# Patient Record
Sex: Male | Born: 1935 | Race: White | Hispanic: No | Marital: Married | Smoking: Former smoker
Health system: Southern US, Community
[De-identification: ages and names within clinical notes are randomized; demographics above are authoritative.]

## PROBLEM LIST (undated history)

## (undated) DIAGNOSIS — N401 Enlarged prostate with lower urinary tract symptoms: Secondary | ICD-10-CM

## (undated) DIAGNOSIS — N138 Other obstructive and reflux uropathy: Secondary | ICD-10-CM

## (undated) DIAGNOSIS — I10 Essential (primary) hypertension: Secondary | ICD-10-CM

## (undated) DIAGNOSIS — L409 Psoriasis, unspecified: Secondary | ICD-10-CM

## (undated) DIAGNOSIS — E1169 Type 2 diabetes mellitus with other specified complication: Secondary | ICD-10-CM

## (undated) DIAGNOSIS — T466X5A Adverse effect of antihyperlipidemic and antiarteriosclerotic drugs, initial encounter: Secondary | ICD-10-CM

## (undated) DIAGNOSIS — G4730 Sleep apnea, unspecified: Secondary | ICD-10-CM

## (undated) DIAGNOSIS — E785 Hyperlipidemia, unspecified: Secondary | ICD-10-CM

## (undated) DIAGNOSIS — I6523 Occlusion and stenosis of bilateral carotid arteries: Secondary | ICD-10-CM

## (undated) DIAGNOSIS — M1A09X0 Idiopathic chronic gout, multiple sites, without tophus (tophi): Secondary | ICD-10-CM

## (undated) DIAGNOSIS — E1140 Type 2 diabetes mellitus with diabetic neuropathy, unspecified: Secondary | ICD-10-CM

## (undated) DIAGNOSIS — M1991 Primary osteoarthritis, unspecified site: Secondary | ICD-10-CM

## (undated) DIAGNOSIS — G720 Drug-induced myopathy: Secondary | ICD-10-CM

## (undated) DIAGNOSIS — M19011 Primary osteoarthritis, right shoulder: Secondary | ICD-10-CM

## (undated) HISTORY — DX: Drug-induced myopathy: G72.0

## (undated) HISTORY — DX: Essential (primary) hypertension: I10

## (undated) HISTORY — DX: Primary osteoarthritis, right shoulder: M19.011

## (undated) HISTORY — PX: REPLACEMENT TOTAL KNEE: SUR1224

## (undated) HISTORY — PX: OTHER SURGICAL HISTORY: SHX169

## (undated) HISTORY — DX: Other obstructive and reflux uropathy: N13.8

## (undated) HISTORY — DX: Idiopathic chronic gout, multiple sites, without tophus (tophi): M1A.09X0

## (undated) HISTORY — DX: Benign prostatic hyperplasia with lower urinary tract symptoms: N40.1

## (undated) HISTORY — DX: Psoriasis, unspecified: L40.9

## (undated) HISTORY — DX: Hyperlipidemia, unspecified: E78.5

## (undated) HISTORY — PX: TOTAL HIP ARTHROPLASTY: SHX124

## (undated) HISTORY — DX: Occlusion and stenosis of bilateral carotid arteries: I65.23

## (undated) HISTORY — DX: Drug-induced myopathy: T46.6X5A

## (undated) HISTORY — DX: Primary osteoarthritis, unspecified site: M19.91

## (undated) HISTORY — DX: Morbid (severe) obesity due to excess calories: E66.01

## (undated) HISTORY — DX: Hyperlipidemia, unspecified: E11.69

## (undated) HISTORY — DX: Type 2 diabetes mellitus with diabetic neuropathy, unspecified: E11.40

---

## 2000-03-20 ENCOUNTER — Ambulatory Visit (HOSPITAL_COMMUNITY): Admission: RE | Admit: 2000-03-20 | Discharge: 2000-03-20 | Payer: Self-pay | Admitting: Orthopedic Surgery

## 2000-03-20 ENCOUNTER — Encounter: Payer: Self-pay | Admitting: Orthopedic Surgery

## 2000-04-08 ENCOUNTER — Encounter: Payer: Self-pay | Admitting: Orthopedic Surgery

## 2000-04-12 ENCOUNTER — Encounter: Payer: Self-pay | Admitting: Orthopedic Surgery

## 2000-04-12 ENCOUNTER — Inpatient Hospital Stay (HOSPITAL_COMMUNITY): Admission: RE | Admit: 2000-04-12 | Discharge: 2000-04-14 | Payer: Self-pay | Admitting: Orthopedic Surgery

## 2010-11-20 LAB — BASIC METABOLIC PANEL
BUN: 16 mg/dL (ref 6–23)
CO2: 29 mEq/L (ref 19–32)
Calcium: 9.7 mg/dL (ref 8.4–10.5)
Chloride: 96 mEq/L (ref 96–112)
Creatinine, Ser: 0.82 mg/dL (ref 0.4–1.5)
GFR calc Af Amer: >60 mL/min (ref >60)
GFR calc non Af Amer: >60 mL/min (ref >60)
Glucose, Bld: 149 mg/dL — ABNORMAL HIGH (ref 70–99)
Potassium: 4.2 mEq/L (ref 3.5–5.1)
Sodium: 136 mEq/L (ref 135–145)

## 2010-11-20 LAB — CBC
HCT: 37.9 % — ABNORMAL LOW (ref 39.0–52.0)
Hemoglobin: 12.6 g/dL — ABNORMAL LOW (ref 13.0–17.0)
MCH: 31.3 pg (ref 26.0–34.0)
MCHC: 33.2 g/dL (ref 30.0–36.0)
MCV: 94 fL (ref 78.0–100.0)
Platelets: 260 10*3/uL (ref 150–400)
RBC: 4.03 MIL/uL — ABNORMAL LOW (ref 4.22–5.81)
RDW: 13.8 % (ref 11.5–15.5)
WBC: 8.1 10*3/uL (ref 4.0–10.5)

## 2010-11-20 LAB — SURGICAL PCR SCREEN
MRSA, PCR: NEGATIVE
Staphylococcus aureus: NEGATIVE

## 2010-11-23 ENCOUNTER — Ambulatory Visit (HOSPITAL_COMMUNITY): Payer: Medicare Other

## 2010-11-23 ENCOUNTER — Other Ambulatory Visit (HOSPITAL_COMMUNITY): Payer: Self-pay | Admitting: Neurosurgery

## 2010-11-23 ENCOUNTER — Inpatient Hospital Stay (HOSPITAL_COMMUNITY)
Admission: RE | Admit: 2010-11-23 | Discharge: 2010-11-24 | DRG: 491 | Disposition: A | Discharge status: Home / Self Care | Payer: Medicare Other | Attending: Neurosurgery | Admitting: Neurosurgery

## 2010-11-23 DIAGNOSIS — Z880 Allergy status to penicillin: Secondary | ICD-10-CM

## 2010-11-23 DIAGNOSIS — M48062 Spinal stenosis, lumbar region with neurogenic claudication: Secondary | ICD-10-CM | POA: Diagnosis present

## 2010-11-23 DIAGNOSIS — M549 Dorsalgia, unspecified: Secondary | ICD-10-CM

## 2010-11-23 DIAGNOSIS — E119 Type 2 diabetes mellitus without complications: Secondary | ICD-10-CM | POA: Diagnosis present

## 2010-11-23 DIAGNOSIS — Z87891 Personal history of nicotine dependence: Secondary | ICD-10-CM

## 2010-11-23 DIAGNOSIS — M5137 Other intervertebral disc degeneration, lumbosacral region: Principal | ICD-10-CM | POA: Diagnosis present

## 2010-11-23 DIAGNOSIS — Z882 Allergy status to sulfonamides status: Secondary | ICD-10-CM

## 2010-11-23 DIAGNOSIS — I10 Essential (primary) hypertension: Secondary | ICD-10-CM | POA: Diagnosis present

## 2010-11-23 DIAGNOSIS — M51379 Other intervertebral disc degeneration, lumbosacral region without mention of lumbar back pain or lower extremity pain: Principal | ICD-10-CM | POA: Diagnosis present

## 2010-11-23 LAB — GLUCOSE, CAPILLARY
Glucose-Capillary: 131 mg/dL — ABNORMAL HIGH (ref 70–99)
Glucose-Capillary: 134 mg/dL — ABNORMAL HIGH (ref 70–99)

## 2010-11-27 LAB — GLUCOSE, CAPILLARY
Glucose-Capillary: 139 mg/dL — ABNORMAL HIGH (ref 70–99)
Glucose-Capillary: 158 mg/dL — ABNORMAL HIGH (ref 70–99)

## 2010-11-28 NOTE — Op Note (Signed)
NAME:  Jermaine Hester, Jermaine Hester                ACCOUNT NO.:  [id number]  MEDICAL RECORD NO.:  [id number]           PATIENT TYPE:  I  LOCATION:  3039                         FACILITY:  [facility]  PHYSICIAN:  Cristi Loron, M.D.DATE OF BIRTH:  08/21/1930  DATE OF PROCEDURE:  11/23/2010 DATE OF DISCHARGE:                              OPERATIVE REPORT   BRIEF HISTORY:  The patient is a 77 year old white male who suffered from back and leg pain consistent with neurogenic claudication.  He has failed medical management, was worked up with a lumbar MRI, which demonstrated the patient had multilevel degenerative changes with stenosis most prominent at L2-3, 3-4, 4-5.  I discussed the various treatments with the patient including surgery.  He has weighed the risks, benefits, and alternatives of surgery and decided to proceed with a decompressive laminectomy.  PREOPERATIVE DIAGNOSES:  Lumbar spinal stenosis, lumbar radiculopathy, degenerative disk disease, and lumbago.  POSTOPERATIVE DIAGNOSES:  Lumbar spinal stenosis, lumbar radiculopathy, degenerative disk disease, and lumbago.  PROCEDURE:  L2-L4 redo laminectomy to decompress the bilateral L2, 3, 4, and 5 nerve roots using microdissection.  SURGEON:  Cristi Loron, MD  ASSISTANT:  Clydene Fake, MD  ANESTHESIA:  General endotracheal.  ESTIMATED BLOOD LOSS:  100 mL.  SPECIMENS:  None.  DRAINS:  None.  COMPLICATIONS:  None.  DESCRIPTION OF PROCEDURE:  The patient was brought to the operating room by the anesthesia team.  General endotracheal anesthesia was induced. The patient was turned to the prone position on the Wilson frame.  His lumbosacral region was then prepared with Betadine scrub and Betadine solution.  Sterile drapes were applied.  I then injected the area to be incised with lidocaine with epinephrine solution.  I then used the scalpel to make a linear midline incision through the patient's previous surgical scar.   I used electrocautery to perform bilateral subperiosteal dissection exposing spinous process lamina from L1 through L5.  I obtained intraoperative radiograph to confirm location and inserted the Versatract retractor for exposure.  We began decompression by using the 15 blade scalpel to incise the interspinous ligaments at L2-3.  I used Leksell rongeur to remove the L2 spinous process as well as the remainder of the L3 and L4 spinous processes.  We encountered scar tissue from the prior surgery as expected.  I then used high-speed drill to perform bilateral L4, L3, and L2 laminotomies.  I then completed the laminectomy at L2, L3, and L4 with Kerrison punch, which was quite a bit of scar tissue.  We carefully dissected through it.  I removed the ligamentum flavum at L1-2, 2-3, 3- 4, and 4-5.  We then brought the operative microscope into the field and under its magnification and illumination completed the microdissection/decompression.  I freed up the thecal sac from the epidural fibrosis.  I used Kerrison punch to remove the excess ligamentum flavum from lateral recesses.  I performed foraminotomies about the L2, L3, L4, and L5 nerve roots and then inspected the intervertebral disk at L2-3, 3-4, and 4-5 and there were bulging, but there was no significant herniations.  I then palpated along the  exit route of the L2, L3, L4, and L5 nerve roots in the neural foramen and they were well decompressed.  We then obtained hemostasis using bipolar electrocautery.  We irrigated the wound out with bacitracin solution. We then removed the retractors and then reapproximated the patient's thoracolumbar fascia with interrupted #1 Vicryl suture, subcutaneous with interrupted 2-0 Vicryl suture, and skin with Steri-Strips and Benzoin.  The wound was then coated with bacitracin ointment.  Sterile dressing was applied.  The drapes were removed.  The patient was subsequently returned to a supine position,  was extubated by anesthesia team, and transported to the post anesthesia care unit in a stable condition.  All sponge, instrument, and needle counts were correct at the end of this case.     Cristi Loron, M.D.     JDJ/MEDQ  D:  11/23/2010  T:  11/24/2010  Job:  [id number]  Electronically Signed by Tressie Stalker M.D. on 11/28/2010 01:45:42 PM

## 2011-03-09 NOTE — Op Note (Signed)
[place]  Patient:    Jermaine Hester, Jermaine Hester                       MRN: [id number] Proc. Date: 04/12/00 Adm. Date:  [id number] Disc. Date: [id number] Attending:  Skip Mayer CC:         Harl Bowie, M.D., [place], [place]                           Operative Report  POSTOPERATIVE DIAGNOSES: 1. Severe lateral recess and spinal stenosis of L3-4 and L4-5. 2. Pseudospondylolisthesis at L4-5.  POSTOPERATIVE DIAGNOSES: 1. Severe lateral recess and spinal stenosis of L3-4 and L4-5. 2. Pseudospondylolisthesis at L4-5.  OPERATIONS: 1. Complete central laminectomy at L3-4 and L4-5. 2. Facetectomy at L3-4 on the left. 3. Decompression of lateral recess at L3-4 and L4-5 on the left.  SURGEON:  Georges Lynch. Darrelyn Hillock, M.D.  ASSISTANT:  Javier Docker, M.D.  INDICATIONS:  Twenty one years ago, the patient had surgery by Dr. Eligha Bridegroom where he had a lumbar laminectomy performed on the left, what appeared to be at the L4-5 level.  A myelogram and CT revealed that he had severe spinal and recess stenosis on the left.  He had persistent left leg pain.  He had an extradural defect at L3-4 with a spondylolisthesis of L4-5.  DESCRIPTION OF PROCEDURE:  Under general anesthesia, routine orthopedic prepping and draping of the lower back was carried out.  Two needles were placed in the back for localization purposes.  X-ray was taken to verify the L3-4 position.  At this time, I first separated the muscle from the lamina on the left at L3-4 and L4-5 and noted that there was a marked abnormality in his anatomy.  He had shingling of the L3-4 area on the left involving the laminas. He also had a defect there at L4-5.  He had severe scar tissue there are we felt that the only safe way to approach this would be to go in and do a central laminectomy by removing the spinous processes of L3 and L4.  We went down and identified the dura in the midline and then worked our way  out laterally on the left.  He had severe scarring of the recess and severe recess stenosis, which required [place] to use the bur to bur out laterally and do a partial facetectomy at L3-4.  Following this, we then identified the root at the L3-4 level and noted it was severely compressed and severely scarred down. The defect actually was coming from the recess stenosis other than the defect on the myelogram.  He had marked thickening of the underlying ligamentum flavum that was embedding down into the root and we gently peeled that off. We then went out and did a wide decompression of the foramen at that level. We also decompressed the root below.  We thoroughly irrigated out the area. Good hemostasis was maintained.  There was no true herniated disk.  Good hemostasis then was maintained with thrombin-soaked Gelfoam and I inserted a Penrose drain and closed the wound in layers in the usual fashion.  The wound would not close tightly in order to provide for good drainage.  The patient had vancomycin preoperatively. DD:  04/12/00 TD:  04/15/00 Job: [id number]

## 2011-03-09 NOTE — Discharge Summary (Signed)
[facility]  Patient:    Jermaine Hester, Jermaine Hester                       MRN: [id number] Adm. Date:  [id number] Disc. Date: [id number] Attending:  Skip Mayer Dictator:   Della Goo, P.A.                           Discharge Summary  ADMISSION DIAGNOSES: 1. Herniated nucleus pulposus with spinal stenosis at L3-4, L4-5 and    spondylolisthesis. 2. Hypertension. 3. Gout. 4. Diet-controlled diabetes. 5. Mild gastroesophageal reflux disease.  DISCHARGE DIAGNOSES: 1. Severe lateral recess and spinal stenosis of L3-4 and L4-5. 2. Pseudospondylolisthesis at L4-5. 3. Herniated nucleus pulposus with spinal stenosis at L3-4, L4-5 and    spondylolisthesis. 4. Hypertension. 5. Gout. 6. Diet-controlled diabetes. 7. Mild gastroesophageal reflux disease.  PROCEDURE: 1. Complete central laminectomy at L3-4 and L4-5. 2. Fasciectomy at L3-4 on the left. 3. Decompression of lateral recess at L3-4 and L4-5 on the left performed    by Dr. Darrelyn Hillock and assisted by Dr. Shelle Iron under general anesthesia.  CONSULTATIONS:  None.  BRIEF HISTORY:  The patient is a 77 year old male status post lumbar laminectomy of L4-5 approximately 21 years prior to this admission.  He has had pain and dysfunction for the past several months and a myelogram and CT revealed severe spinal stenosis as well as recess stenosis on the left. Extradural defect was noted at L3-4 with a spondylolisthesis at L4-5.  It was felt that he would require surgical intervention and was admitted for the procedures as stated above.  BRIEF HOSPITAL COURSE:  The patient tolerated the procedure without difficulty.  On the first postoperative day, neurovascular and motor function was found to be intact.  The patient did have significant bloody drainage secondary to a Penrose drain in the wound.  This was removed and the patient continued with copious drainage throughout the day saturating the dressing many  times. He received physical therapy for ambulation and gait training and was slow with advancement secondary to discomfort.  He remained in the hospital for an additional evening and on the next day he was more comfortable and was able to be more active.  The patient was voiding without difficulty. Dressing change was done and wound had less drainage and minimal erythema and edema.  He was afebrile with vital signs stable.  The patient was discharged on the second postoperative day in stable condition.  PERTINENT LABORATORY VALUES:  Admission labs included a CBC that was within normal limits.  Coagulation study within normal limits.  Chemistry studies within normal limits with the except of BUN of 25, glucose 123 and ALT 38. Urinalysis on admission was normal.  EKG:  On admission, showed normal sinus rhythm with no old tracings for comparison.  PLAN:  The patient was discharged to his home.  He is given prescriptions for Robaxin 1 every 6-8 hours as needed for spasms and Percocet 1-2 every 4-6 hours as needed for pain.  He will followup with Dr. Darrelyn Hillock in two weeks.  He will resume a regular diet.  The patient was given instructions for dressing changes and will do this at home daily.  He will continue to ambulate as tolerated.  The patient was given a back care booklet and will follow these instructions at home.  He and his family were advised to call the office if there are  further questions or concerns prior to his return office visit. DD:  05/16/00 TD:  05/19/00 Job: [id number]

## 2012-03-10 DIAGNOSIS — R351 Nocturia: Secondary | ICD-10-CM | POA: Diagnosis not present

## 2012-03-10 DIAGNOSIS — N401 Enlarged prostate with lower urinary tract symptoms: Secondary | ICD-10-CM | POA: Diagnosis not present

## 2012-06-09 DIAGNOSIS — E669 Obesity, unspecified: Secondary | ICD-10-CM | POA: Diagnosis not present

## 2012-06-09 DIAGNOSIS — E119 Type 2 diabetes mellitus without complications: Secondary | ICD-10-CM | POA: Diagnosis not present

## 2012-06-09 DIAGNOSIS — I10 Essential (primary) hypertension: Secondary | ICD-10-CM | POA: Diagnosis not present

## 2012-06-09 DIAGNOSIS — Z79899 Other long term (current) drug therapy: Secondary | ICD-10-CM | POA: Diagnosis not present

## 2012-06-09 DIAGNOSIS — M109 Gout, unspecified: Secondary | ICD-10-CM | POA: Diagnosis not present

## 2012-07-08 IMAGING — CR DG LUMBAR SPINE 1V
1 series · 1 of 1 positions shown · non-contrast
Comparison: None.

CLINICAL DATA: L2-3 laminectomy with possible right L3-4
discectomy.

LUMBAR SPINE - 1 VIEW

[view not recorded]
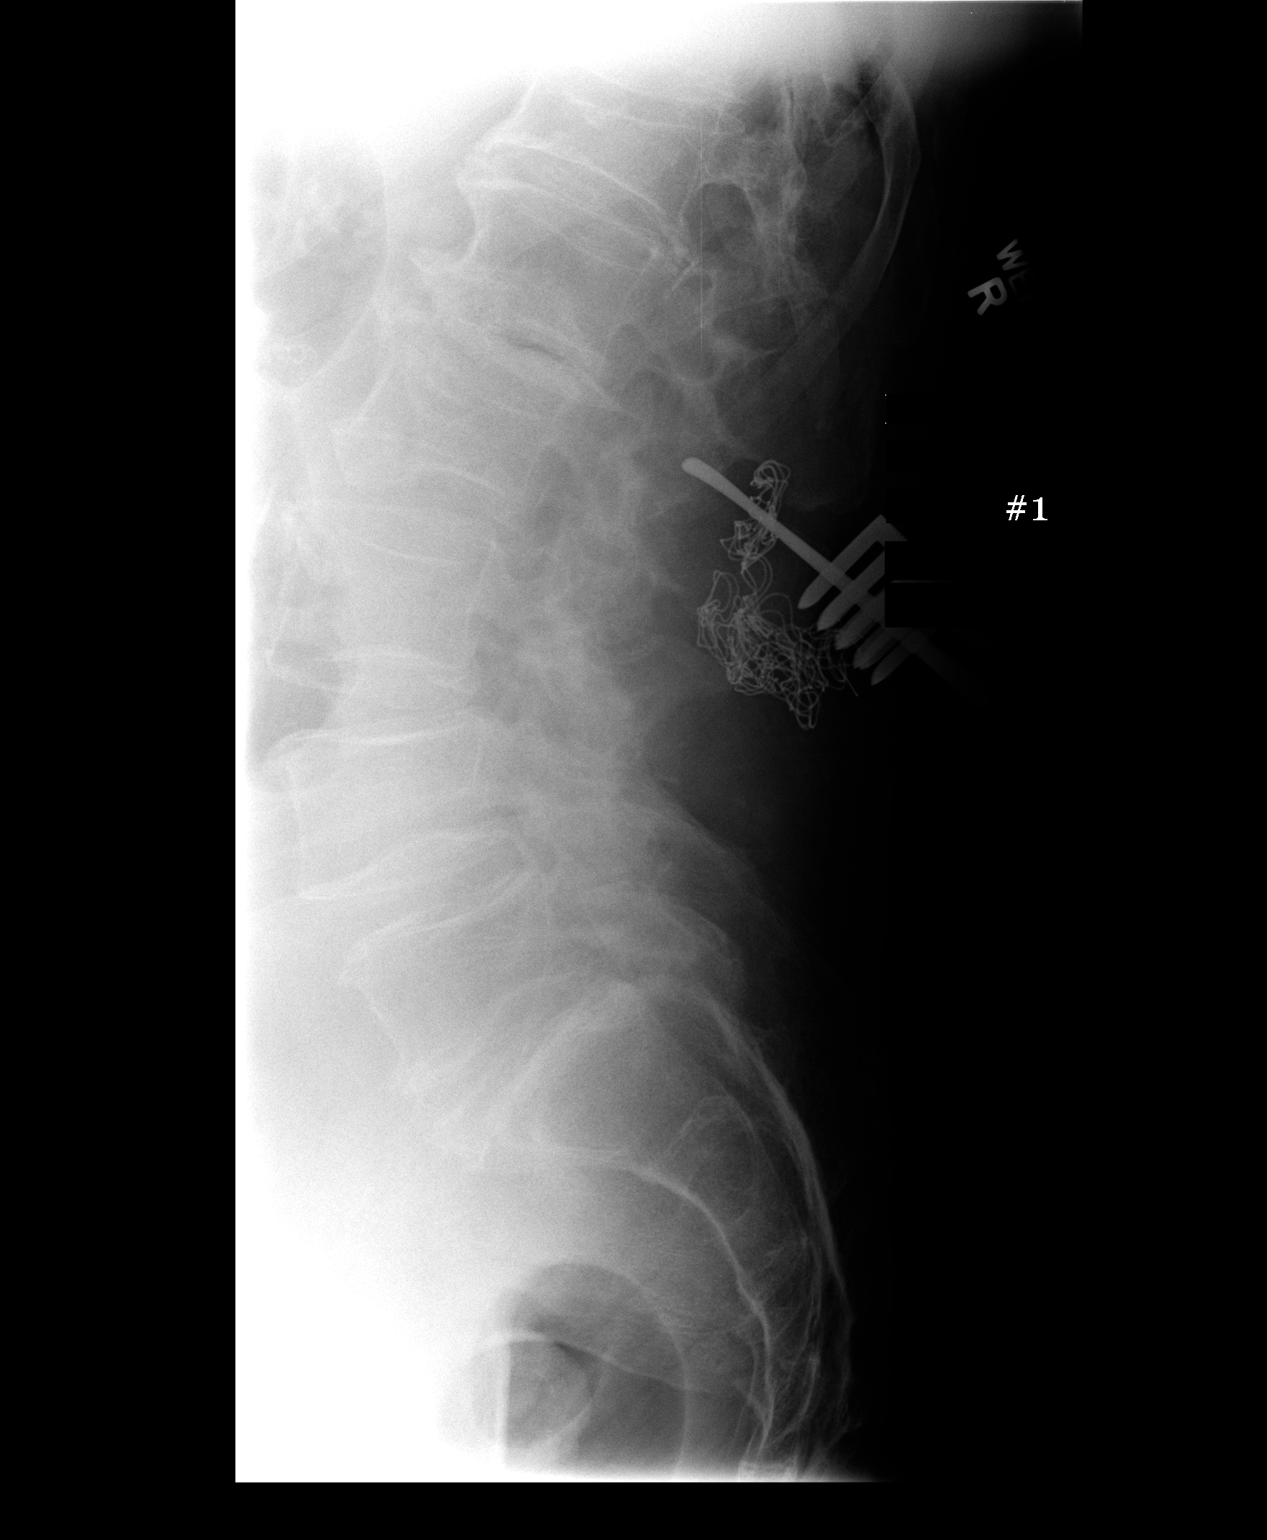

[1 of 1 positions shown; findings below may reference images not displayed]

FINDINGS: The lowest full intervertebral disk space is labeled L5-
S1.  If procedural intervention is to be performed, careful
correlation with this numbering strategy is recommended.

A blunt probe projects over the superior margin of the expected
location of the spinous process of L2, and is oriented towards the
top of the L2 vertebral body.
IMPRESSION: 1.  The blunt probe tip projects below the L1 spinous process and
along the expected location of the superior portion of the L2
spinous process, although the L2 spinous process is poorly seen.

## 2012-07-18 DIAGNOSIS — M25559 Pain in unspecified hip: Secondary | ICD-10-CM | POA: Diagnosis not present

## 2012-07-18 DIAGNOSIS — M545 Low back pain, unspecified: Secondary | ICD-10-CM | POA: Diagnosis not present

## 2012-07-22 DIAGNOSIS — M5126 Other intervertebral disc displacement, lumbar region: Secondary | ICD-10-CM | POA: Diagnosis not present

## 2012-07-22 DIAGNOSIS — M545 Low back pain, unspecified: Secondary | ICD-10-CM | POA: Diagnosis not present

## 2012-07-22 DIAGNOSIS — Z9889 Other specified postprocedural states: Secondary | ICD-10-CM | POA: Diagnosis not present

## 2012-09-11 DIAGNOSIS — Z125 Encounter for screening for malignant neoplasm of prostate: Secondary | ICD-10-CM | POA: Diagnosis not present

## 2012-09-11 DIAGNOSIS — N401 Enlarged prostate with lower urinary tract symptoms: Secondary | ICD-10-CM | POA: Diagnosis not present

## 2012-09-12 DIAGNOSIS — Z23 Encounter for immunization: Secondary | ICD-10-CM | POA: Diagnosis not present

## 2012-09-30 DIAGNOSIS — M48061 Spinal stenosis, lumbar region without neurogenic claudication: Secondary | ICD-10-CM | POA: Diagnosis not present

## 2012-09-30 DIAGNOSIS — M5137 Other intervertebral disc degeneration, lumbosacral region: Secondary | ICD-10-CM | POA: Diagnosis not present

## 2012-09-30 DIAGNOSIS — Q762 Congenital spondylolisthesis: Secondary | ICD-10-CM | POA: Diagnosis not present

## 2012-10-27 DIAGNOSIS — IMO0002 Reserved for concepts with insufficient information to code with codable children: Secondary | ICD-10-CM | POA: Diagnosis not present

## 2012-10-27 DIAGNOSIS — M5137 Other intervertebral disc degeneration, lumbosacral region: Secondary | ICD-10-CM | POA: Diagnosis not present

## 2012-10-27 DIAGNOSIS — M5126 Other intervertebral disc displacement, lumbar region: Secondary | ICD-10-CM | POA: Diagnosis not present

## 2012-11-17 DIAGNOSIS — M5137 Other intervertebral disc degeneration, lumbosacral region: Secondary | ICD-10-CM | POA: Diagnosis not present

## 2012-11-17 DIAGNOSIS — IMO0002 Reserved for concepts with insufficient information to code with codable children: Secondary | ICD-10-CM | POA: Diagnosis not present

## 2012-11-17 DIAGNOSIS — M5126 Other intervertebral disc displacement, lumbar region: Secondary | ICD-10-CM | POA: Diagnosis not present

## 2012-12-08 DIAGNOSIS — IMO0002 Reserved for concepts with insufficient information to code with codable children: Secondary | ICD-10-CM | POA: Diagnosis not present

## 2012-12-08 DIAGNOSIS — M5126 Other intervertebral disc displacement, lumbar region: Secondary | ICD-10-CM | POA: Diagnosis not present

## 2012-12-08 DIAGNOSIS — M5137 Other intervertebral disc degeneration, lumbosacral region: Secondary | ICD-10-CM | POA: Diagnosis not present

## 2013-01-06 DIAGNOSIS — IMO0002 Reserved for concepts with insufficient information to code with codable children: Secondary | ICD-10-CM | POA: Diagnosis not present

## 2013-01-06 DIAGNOSIS — Z96619 Presence of unspecified artificial shoulder joint: Secondary | ICD-10-CM | POA: Diagnosis not present

## 2013-01-06 DIAGNOSIS — R52 Pain, unspecified: Secondary | ICD-10-CM | POA: Insufficient documentation

## 2013-01-06 DIAGNOSIS — M25519 Pain in unspecified shoulder: Secondary | ICD-10-CM | POA: Diagnosis not present

## 2013-01-13 DIAGNOSIS — Z96619 Presence of unspecified artificial shoulder joint: Secondary | ICD-10-CM | POA: Diagnosis not present

## 2013-01-13 DIAGNOSIS — M949 Disorder of cartilage, unspecified: Secondary | ICD-10-CM | POA: Diagnosis not present

## 2013-01-13 DIAGNOSIS — I2510 Atherosclerotic heart disease of native coronary artery without angina pectoris: Secondary | ICD-10-CM | POA: Diagnosis not present

## 2013-01-13 DIAGNOSIS — M25519 Pain in unspecified shoulder: Secondary | ICD-10-CM | POA: Diagnosis not present

## 2013-01-13 DIAGNOSIS — Z0389 Encounter for observation for other suspected diseases and conditions ruled out: Secondary | ICD-10-CM | POA: Diagnosis not present

## 2013-01-13 DIAGNOSIS — R52 Pain, unspecified: Secondary | ICD-10-CM | POA: Diagnosis not present

## 2013-01-13 DIAGNOSIS — M899 Disorder of bone, unspecified: Secondary | ICD-10-CM | POA: Diagnosis not present

## 2013-01-21 DIAGNOSIS — Z96619 Presence of unspecified artificial shoulder joint: Secondary | ICD-10-CM | POA: Diagnosis not present

## 2013-01-21 DIAGNOSIS — Z471 Aftercare following joint replacement surgery: Secondary | ICD-10-CM | POA: Diagnosis not present

## 2013-01-26 DIAGNOSIS — Z96619 Presence of unspecified artificial shoulder joint: Secondary | ICD-10-CM | POA: Diagnosis not present

## 2013-01-26 DIAGNOSIS — M719 Bursopathy, unspecified: Secondary | ICD-10-CM | POA: Diagnosis not present

## 2013-01-26 DIAGNOSIS — M67919 Unspecified disorder of synovium and tendon, unspecified shoulder: Secondary | ICD-10-CM | POA: Diagnosis not present

## 2013-01-26 DIAGNOSIS — M25419 Effusion, unspecified shoulder: Secondary | ICD-10-CM | POA: Diagnosis not present

## 2013-01-28 DIAGNOSIS — M25519 Pain in unspecified shoulder: Secondary | ICD-10-CM | POA: Diagnosis not present

## 2013-01-28 DIAGNOSIS — Z96619 Presence of unspecified artificial shoulder joint: Secondary | ICD-10-CM | POA: Insufficient documentation

## 2013-01-28 DIAGNOSIS — S43429A Sprain of unspecified rotator cuff capsule, initial encounter: Secondary | ICD-10-CM | POA: Diagnosis not present

## 2013-02-06 DIAGNOSIS — G4730 Sleep apnea, unspecified: Secondary | ICD-10-CM | POA: Insufficient documentation

## 2013-02-06 DIAGNOSIS — S43429A Sprain of unspecified rotator cuff capsule, initial encounter: Secondary | ICD-10-CM | POA: Diagnosis not present

## 2013-02-06 DIAGNOSIS — R112 Nausea with vomiting, unspecified: Secondary | ICD-10-CM | POA: Insufficient documentation

## 2013-02-06 DIAGNOSIS — Z419 Encounter for procedure for purposes other than remedying health state, unspecified: Secondary | ICD-10-CM | POA: Insufficient documentation

## 2013-02-06 DIAGNOSIS — Z01810 Encounter for preprocedural cardiovascular examination: Secondary | ICD-10-CM | POA: Diagnosis not present

## 2013-02-06 DIAGNOSIS — I10 Essential (primary) hypertension: Secondary | ICD-10-CM | POA: Insufficient documentation

## 2013-02-06 DIAGNOSIS — E119 Type 2 diabetes mellitus without complications: Secondary | ICD-10-CM | POA: Diagnosis not present

## 2013-02-10 DIAGNOSIS — G8918 Other acute postprocedural pain: Secondary | ICD-10-CM | POA: Diagnosis not present

## 2013-02-10 DIAGNOSIS — M75100 Unspecified rotator cuff tear or rupture of unspecified shoulder, not specified as traumatic: Secondary | ICD-10-CM | POA: Insufficient documentation

## 2013-02-10 DIAGNOSIS — S43429A Sprain of unspecified rotator cuff capsule, initial encounter: Secondary | ICD-10-CM | POA: Diagnosis not present

## 2013-02-10 DIAGNOSIS — Z96619 Presence of unspecified artificial shoulder joint: Secondary | ICD-10-CM | POA: Diagnosis not present

## 2013-02-10 DIAGNOSIS — E669 Obesity, unspecified: Secondary | ICD-10-CM | POA: Diagnosis not present

## 2013-02-10 DIAGNOSIS — M25419 Effusion, unspecified shoulder: Secondary | ICD-10-CM | POA: Diagnosis not present

## 2013-02-10 DIAGNOSIS — M67919 Unspecified disorder of synovium and tendon, unspecified shoulder: Secondary | ICD-10-CM | POA: Diagnosis not present

## 2013-02-10 DIAGNOSIS — E119 Type 2 diabetes mellitus without complications: Secondary | ICD-10-CM | POA: Diagnosis not present

## 2013-02-10 DIAGNOSIS — I10 Essential (primary) hypertension: Secondary | ICD-10-CM | POA: Diagnosis not present

## 2013-02-10 DIAGNOSIS — G4733 Obstructive sleep apnea (adult) (pediatric): Secondary | ICD-10-CM | POA: Diagnosis not present

## 2013-02-11 DIAGNOSIS — E119 Type 2 diabetes mellitus without complications: Secondary | ICD-10-CM | POA: Diagnosis not present

## 2013-02-11 DIAGNOSIS — E669 Obesity, unspecified: Secondary | ICD-10-CM | POA: Diagnosis not present

## 2013-02-11 DIAGNOSIS — G4733 Obstructive sleep apnea (adult) (pediatric): Secondary | ICD-10-CM | POA: Diagnosis not present

## 2013-02-11 DIAGNOSIS — S43429A Sprain of unspecified rotator cuff capsule, initial encounter: Secondary | ICD-10-CM | POA: Diagnosis not present

## 2013-02-11 DIAGNOSIS — Z96619 Presence of unspecified artificial shoulder joint: Secondary | ICD-10-CM | POA: Diagnosis not present

## 2013-02-11 DIAGNOSIS — I10 Essential (primary) hypertension: Secondary | ICD-10-CM | POA: Diagnosis not present

## 2013-02-25 DIAGNOSIS — S43429A Sprain of unspecified rotator cuff capsule, initial encounter: Secondary | ICD-10-CM | POA: Diagnosis not present

## 2013-02-25 DIAGNOSIS — Z96619 Presence of unspecified artificial shoulder joint: Secondary | ICD-10-CM | POA: Diagnosis not present

## 2013-03-03 DIAGNOSIS — M25619 Stiffness of unspecified shoulder, not elsewhere classified: Secondary | ICD-10-CM | POA: Diagnosis not present

## 2013-03-03 DIAGNOSIS — IMO0001 Reserved for inherently not codable concepts without codable children: Secondary | ICD-10-CM | POA: Diagnosis not present

## 2013-03-03 DIAGNOSIS — M25519 Pain in unspecified shoulder: Secondary | ICD-10-CM | POA: Diagnosis not present

## 2013-03-03 DIAGNOSIS — R293 Abnormal posture: Secondary | ICD-10-CM | POA: Diagnosis not present

## 2013-03-03 DIAGNOSIS — Z4789 Encounter for other orthopedic aftercare: Secondary | ICD-10-CM | POA: Diagnosis not present

## 2013-03-03 DIAGNOSIS — M6281 Muscle weakness (generalized): Secondary | ICD-10-CM | POA: Diagnosis not present

## 2013-03-03 DIAGNOSIS — S43429A Sprain of unspecified rotator cuff capsule, initial encounter: Secondary | ICD-10-CM | POA: Diagnosis not present

## 2013-03-05 DIAGNOSIS — M4310 Spondylolisthesis, site unspecified: Secondary | ICD-10-CM | POA: Insufficient documentation

## 2013-03-05 DIAGNOSIS — Q762 Congenital spondylolisthesis: Secondary | ICD-10-CM | POA: Diagnosis not present

## 2013-03-05 DIAGNOSIS — M5137 Other intervertebral disc degeneration, lumbosacral region: Secondary | ICD-10-CM | POA: Insufficient documentation

## 2013-03-05 DIAGNOSIS — M949 Disorder of cartilage, unspecified: Secondary | ICD-10-CM | POA: Diagnosis not present

## 2013-03-05 DIAGNOSIS — M899 Disorder of bone, unspecified: Secondary | ICD-10-CM | POA: Diagnosis not present

## 2013-03-05 DIAGNOSIS — M545 Low back pain, unspecified: Secondary | ICD-10-CM | POA: Diagnosis not present

## 2013-03-05 DIAGNOSIS — M48061 Spinal stenosis, lumbar region without neurogenic claudication: Secondary | ICD-10-CM | POA: Insufficient documentation

## 2013-03-05 DIAGNOSIS — M47817 Spondylosis without myelopathy or radiculopathy, lumbosacral region: Secondary | ICD-10-CM | POA: Diagnosis not present

## 2013-03-06 DIAGNOSIS — S43429A Sprain of unspecified rotator cuff capsule, initial encounter: Secondary | ICD-10-CM | POA: Diagnosis not present

## 2013-03-06 DIAGNOSIS — M6281 Muscle weakness (generalized): Secondary | ICD-10-CM | POA: Diagnosis not present

## 2013-03-06 DIAGNOSIS — IMO0001 Reserved for inherently not codable concepts without codable children: Secondary | ICD-10-CM | POA: Diagnosis not present

## 2013-03-06 DIAGNOSIS — M25619 Stiffness of unspecified shoulder, not elsewhere classified: Secondary | ICD-10-CM | POA: Diagnosis not present

## 2013-03-06 DIAGNOSIS — R293 Abnormal posture: Secondary | ICD-10-CM | POA: Diagnosis not present

## 2013-03-06 DIAGNOSIS — M25519 Pain in unspecified shoulder: Secondary | ICD-10-CM | POA: Diagnosis not present

## 2013-03-10 DIAGNOSIS — M25619 Stiffness of unspecified shoulder, not elsewhere classified: Secondary | ICD-10-CM | POA: Diagnosis not present

## 2013-03-10 DIAGNOSIS — R293 Abnormal posture: Secondary | ICD-10-CM | POA: Diagnosis not present

## 2013-03-10 DIAGNOSIS — IMO0001 Reserved for inherently not codable concepts without codable children: Secondary | ICD-10-CM | POA: Diagnosis not present

## 2013-03-10 DIAGNOSIS — S43429A Sprain of unspecified rotator cuff capsule, initial encounter: Secondary | ICD-10-CM | POA: Diagnosis not present

## 2013-03-10 DIAGNOSIS — M6281 Muscle weakness (generalized): Secondary | ICD-10-CM | POA: Diagnosis not present

## 2013-03-10 DIAGNOSIS — M25519 Pain in unspecified shoulder: Secondary | ICD-10-CM | POA: Diagnosis not present

## 2013-03-11 DIAGNOSIS — N401 Enlarged prostate with lower urinary tract symptoms: Secondary | ICD-10-CM | POA: Diagnosis not present

## 2013-03-11 DIAGNOSIS — R351 Nocturia: Secondary | ICD-10-CM | POA: Diagnosis not present

## 2013-03-11 DIAGNOSIS — N433 Hydrocele, unspecified: Secondary | ICD-10-CM | POA: Diagnosis not present

## 2013-03-13 DIAGNOSIS — M6281 Muscle weakness (generalized): Secondary | ICD-10-CM | POA: Diagnosis not present

## 2013-03-13 DIAGNOSIS — R293 Abnormal posture: Secondary | ICD-10-CM | POA: Diagnosis not present

## 2013-03-13 DIAGNOSIS — IMO0001 Reserved for inherently not codable concepts without codable children: Secondary | ICD-10-CM | POA: Diagnosis not present

## 2013-03-13 DIAGNOSIS — M25619 Stiffness of unspecified shoulder, not elsewhere classified: Secondary | ICD-10-CM | POA: Diagnosis not present

## 2013-03-13 DIAGNOSIS — M25519 Pain in unspecified shoulder: Secondary | ICD-10-CM | POA: Diagnosis not present

## 2013-03-13 DIAGNOSIS — S43429A Sprain of unspecified rotator cuff capsule, initial encounter: Secondary | ICD-10-CM | POA: Diagnosis not present

## 2013-03-17 DIAGNOSIS — M6281 Muscle weakness (generalized): Secondary | ICD-10-CM | POA: Diagnosis not present

## 2013-03-17 DIAGNOSIS — IMO0001 Reserved for inherently not codable concepts without codable children: Secondary | ICD-10-CM | POA: Diagnosis not present

## 2013-03-17 DIAGNOSIS — S43429A Sprain of unspecified rotator cuff capsule, initial encounter: Secondary | ICD-10-CM | POA: Diagnosis not present

## 2013-03-17 DIAGNOSIS — R293 Abnormal posture: Secondary | ICD-10-CM | POA: Diagnosis not present

## 2013-03-17 DIAGNOSIS — M25619 Stiffness of unspecified shoulder, not elsewhere classified: Secondary | ICD-10-CM | POA: Diagnosis not present

## 2013-03-17 DIAGNOSIS — M25519 Pain in unspecified shoulder: Secondary | ICD-10-CM | POA: Diagnosis not present

## 2013-03-19 DIAGNOSIS — R293 Abnormal posture: Secondary | ICD-10-CM | POA: Diagnosis not present

## 2013-03-19 DIAGNOSIS — IMO0001 Reserved for inherently not codable concepts without codable children: Secondary | ICD-10-CM | POA: Diagnosis not present

## 2013-03-19 DIAGNOSIS — S43429A Sprain of unspecified rotator cuff capsule, initial encounter: Secondary | ICD-10-CM | POA: Diagnosis not present

## 2013-03-19 DIAGNOSIS — M6281 Muscle weakness (generalized): Secondary | ICD-10-CM | POA: Diagnosis not present

## 2013-03-19 DIAGNOSIS — M25619 Stiffness of unspecified shoulder, not elsewhere classified: Secondary | ICD-10-CM | POA: Diagnosis not present

## 2013-03-19 DIAGNOSIS — M25519 Pain in unspecified shoulder: Secondary | ICD-10-CM | POA: Diagnosis not present

## 2013-03-23 DIAGNOSIS — S43429A Sprain of unspecified rotator cuff capsule, initial encounter: Secondary | ICD-10-CM | POA: Diagnosis not present

## 2013-03-23 DIAGNOSIS — Z96619 Presence of unspecified artificial shoulder joint: Secondary | ICD-10-CM | POA: Diagnosis not present

## 2013-03-24 DIAGNOSIS — M6281 Muscle weakness (generalized): Secondary | ICD-10-CM | POA: Diagnosis not present

## 2013-03-24 DIAGNOSIS — M25519 Pain in unspecified shoulder: Secondary | ICD-10-CM | POA: Diagnosis not present

## 2013-03-24 DIAGNOSIS — IMO0001 Reserved for inherently not codable concepts without codable children: Secondary | ICD-10-CM | POA: Diagnosis not present

## 2013-03-24 DIAGNOSIS — M25619 Stiffness of unspecified shoulder, not elsewhere classified: Secondary | ICD-10-CM | POA: Diagnosis not present

## 2013-03-24 DIAGNOSIS — R293 Abnormal posture: Secondary | ICD-10-CM | POA: Diagnosis not present

## 2013-03-24 DIAGNOSIS — S43429A Sprain of unspecified rotator cuff capsule, initial encounter: Secondary | ICD-10-CM | POA: Diagnosis not present

## 2013-03-24 DIAGNOSIS — Z4789 Encounter for other orthopedic aftercare: Secondary | ICD-10-CM | POA: Diagnosis not present

## 2013-03-26 DIAGNOSIS — IMO0001 Reserved for inherently not codable concepts without codable children: Secondary | ICD-10-CM | POA: Diagnosis not present

## 2013-03-26 DIAGNOSIS — S43429A Sprain of unspecified rotator cuff capsule, initial encounter: Secondary | ICD-10-CM | POA: Diagnosis not present

## 2013-03-26 DIAGNOSIS — M25619 Stiffness of unspecified shoulder, not elsewhere classified: Secondary | ICD-10-CM | POA: Diagnosis not present

## 2013-03-26 DIAGNOSIS — M6281 Muscle weakness (generalized): Secondary | ICD-10-CM | POA: Diagnosis not present

## 2013-03-26 DIAGNOSIS — R293 Abnormal posture: Secondary | ICD-10-CM | POA: Diagnosis not present

## 2013-03-26 DIAGNOSIS — M25519 Pain in unspecified shoulder: Secondary | ICD-10-CM | POA: Diagnosis not present

## 2013-03-31 DIAGNOSIS — R293 Abnormal posture: Secondary | ICD-10-CM | POA: Diagnosis not present

## 2013-03-31 DIAGNOSIS — M25519 Pain in unspecified shoulder: Secondary | ICD-10-CM | POA: Diagnosis not present

## 2013-03-31 DIAGNOSIS — IMO0001 Reserved for inherently not codable concepts without codable children: Secondary | ICD-10-CM | POA: Diagnosis not present

## 2013-03-31 DIAGNOSIS — M6281 Muscle weakness (generalized): Secondary | ICD-10-CM | POA: Diagnosis not present

## 2013-03-31 DIAGNOSIS — M25619 Stiffness of unspecified shoulder, not elsewhere classified: Secondary | ICD-10-CM | POA: Diagnosis not present

## 2013-03-31 DIAGNOSIS — S43429A Sprain of unspecified rotator cuff capsule, initial encounter: Secondary | ICD-10-CM | POA: Diagnosis not present

## 2013-04-02 DIAGNOSIS — M6281 Muscle weakness (generalized): Secondary | ICD-10-CM | POA: Diagnosis not present

## 2013-04-02 DIAGNOSIS — M25619 Stiffness of unspecified shoulder, not elsewhere classified: Secondary | ICD-10-CM | POA: Diagnosis not present

## 2013-04-02 DIAGNOSIS — R293 Abnormal posture: Secondary | ICD-10-CM | POA: Diagnosis not present

## 2013-04-02 DIAGNOSIS — M25519 Pain in unspecified shoulder: Secondary | ICD-10-CM | POA: Diagnosis not present

## 2013-04-02 DIAGNOSIS — IMO0001 Reserved for inherently not codable concepts without codable children: Secondary | ICD-10-CM | POA: Diagnosis not present

## 2013-04-02 DIAGNOSIS — S43429A Sprain of unspecified rotator cuff capsule, initial encounter: Secondary | ICD-10-CM | POA: Diagnosis not present

## 2013-04-07 DIAGNOSIS — R293 Abnormal posture: Secondary | ICD-10-CM | POA: Diagnosis not present

## 2013-04-07 DIAGNOSIS — M6281 Muscle weakness (generalized): Secondary | ICD-10-CM | POA: Diagnosis not present

## 2013-04-07 DIAGNOSIS — M25619 Stiffness of unspecified shoulder, not elsewhere classified: Secondary | ICD-10-CM | POA: Diagnosis not present

## 2013-04-07 DIAGNOSIS — M25519 Pain in unspecified shoulder: Secondary | ICD-10-CM | POA: Diagnosis not present

## 2013-04-07 DIAGNOSIS — S43429A Sprain of unspecified rotator cuff capsule, initial encounter: Secondary | ICD-10-CM | POA: Diagnosis not present

## 2013-04-07 DIAGNOSIS — IMO0001 Reserved for inherently not codable concepts without codable children: Secondary | ICD-10-CM | POA: Diagnosis not present

## 2013-04-09 DIAGNOSIS — M25619 Stiffness of unspecified shoulder, not elsewhere classified: Secondary | ICD-10-CM | POA: Diagnosis not present

## 2013-04-09 DIAGNOSIS — R293 Abnormal posture: Secondary | ICD-10-CM | POA: Diagnosis not present

## 2013-04-09 DIAGNOSIS — IMO0001 Reserved for inherently not codable concepts without codable children: Secondary | ICD-10-CM | POA: Diagnosis not present

## 2013-04-09 DIAGNOSIS — M25519 Pain in unspecified shoulder: Secondary | ICD-10-CM | POA: Diagnosis not present

## 2013-04-09 DIAGNOSIS — M6281 Muscle weakness (generalized): Secondary | ICD-10-CM | POA: Diagnosis not present

## 2013-04-09 DIAGNOSIS — S43429A Sprain of unspecified rotator cuff capsule, initial encounter: Secondary | ICD-10-CM | POA: Diagnosis not present

## 2013-04-14 DIAGNOSIS — M25519 Pain in unspecified shoulder: Secondary | ICD-10-CM | POA: Diagnosis not present

## 2013-04-14 DIAGNOSIS — S43429A Sprain of unspecified rotator cuff capsule, initial encounter: Secondary | ICD-10-CM | POA: Diagnosis not present

## 2013-04-14 DIAGNOSIS — M25619 Stiffness of unspecified shoulder, not elsewhere classified: Secondary | ICD-10-CM | POA: Diagnosis not present

## 2013-04-14 DIAGNOSIS — R293 Abnormal posture: Secondary | ICD-10-CM | POA: Diagnosis not present

## 2013-04-14 DIAGNOSIS — IMO0001 Reserved for inherently not codable concepts without codable children: Secondary | ICD-10-CM | POA: Diagnosis not present

## 2013-04-14 DIAGNOSIS — M6281 Muscle weakness (generalized): Secondary | ICD-10-CM | POA: Diagnosis not present

## 2013-04-16 DIAGNOSIS — IMO0001 Reserved for inherently not codable concepts without codable children: Secondary | ICD-10-CM | POA: Diagnosis not present

## 2013-04-16 DIAGNOSIS — R293 Abnormal posture: Secondary | ICD-10-CM | POA: Diagnosis not present

## 2013-04-16 DIAGNOSIS — M25619 Stiffness of unspecified shoulder, not elsewhere classified: Secondary | ICD-10-CM | POA: Diagnosis not present

## 2013-04-16 DIAGNOSIS — M6281 Muscle weakness (generalized): Secondary | ICD-10-CM | POA: Diagnosis not present

## 2013-04-16 DIAGNOSIS — M25519 Pain in unspecified shoulder: Secondary | ICD-10-CM | POA: Diagnosis not present

## 2013-04-16 DIAGNOSIS — S43429A Sprain of unspecified rotator cuff capsule, initial encounter: Secondary | ICD-10-CM | POA: Diagnosis not present

## 2013-04-22 DIAGNOSIS — IMO0001 Reserved for inherently not codable concepts without codable children: Secondary | ICD-10-CM | POA: Diagnosis not present

## 2013-04-22 DIAGNOSIS — M25619 Stiffness of unspecified shoulder, not elsewhere classified: Secondary | ICD-10-CM | POA: Diagnosis not present

## 2013-04-22 DIAGNOSIS — M6281 Muscle weakness (generalized): Secondary | ICD-10-CM | POA: Diagnosis not present

## 2013-04-22 DIAGNOSIS — M25519 Pain in unspecified shoulder: Secondary | ICD-10-CM | POA: Diagnosis not present

## 2013-04-22 DIAGNOSIS — Z4789 Encounter for other orthopedic aftercare: Secondary | ICD-10-CM | POA: Diagnosis not present

## 2013-04-22 DIAGNOSIS — S43429A Sprain of unspecified rotator cuff capsule, initial encounter: Secondary | ICD-10-CM | POA: Diagnosis not present

## 2013-04-22 DIAGNOSIS — R293 Abnormal posture: Secondary | ICD-10-CM | POA: Diagnosis not present

## 2013-05-21 DIAGNOSIS — Q762 Congenital spondylolisthesis: Secondary | ICD-10-CM | POA: Diagnosis not present

## 2013-05-21 DIAGNOSIS — M949 Disorder of cartilage, unspecified: Secondary | ICD-10-CM | POA: Diagnosis not present

## 2013-05-21 DIAGNOSIS — M5137 Other intervertebral disc degeneration, lumbosacral region: Secondary | ICD-10-CM | POA: Diagnosis not present

## 2013-05-21 DIAGNOSIS — M47817 Spondylosis without myelopathy or radiculopathy, lumbosacral region: Secondary | ICD-10-CM | POA: Diagnosis not present

## 2013-05-21 DIAGNOSIS — M48061 Spinal stenosis, lumbar region without neurogenic claudication: Secondary | ICD-10-CM | POA: Diagnosis not present

## 2013-05-21 DIAGNOSIS — M899 Disorder of bone, unspecified: Secondary | ICD-10-CM | POA: Diagnosis not present

## 2013-05-27 DIAGNOSIS — M461 Sacroiliitis, not elsewhere classified: Secondary | ICD-10-CM | POA: Diagnosis not present

## 2013-06-03 DIAGNOSIS — M461 Sacroiliitis, not elsewhere classified: Secondary | ICD-10-CM | POA: Diagnosis not present

## 2013-06-23 DIAGNOSIS — IMO0002 Reserved for concepts with insufficient information to code with codable children: Secondary | ICD-10-CM | POA: Diagnosis not present

## 2013-06-23 DIAGNOSIS — M999 Biomechanical lesion, unspecified: Secondary | ICD-10-CM | POA: Diagnosis not present

## 2013-06-24 DIAGNOSIS — IMO0002 Reserved for concepts with insufficient information to code with codable children: Secondary | ICD-10-CM | POA: Diagnosis not present

## 2013-06-24 DIAGNOSIS — M999 Biomechanical lesion, unspecified: Secondary | ICD-10-CM | POA: Diagnosis not present

## 2013-06-25 DIAGNOSIS — M47817 Spondylosis without myelopathy or radiculopathy, lumbosacral region: Secondary | ICD-10-CM | POA: Diagnosis not present

## 2013-06-26 DIAGNOSIS — IMO0002 Reserved for concepts with insufficient information to code with codable children: Secondary | ICD-10-CM | POA: Diagnosis not present

## 2013-06-26 DIAGNOSIS — M999 Biomechanical lesion, unspecified: Secondary | ICD-10-CM | POA: Diagnosis not present

## 2013-07-08 DIAGNOSIS — M5380 Other specified dorsopathies, site unspecified: Secondary | ICD-10-CM | POA: Diagnosis not present

## 2013-07-10 DIAGNOSIS — E1149 Type 2 diabetes mellitus with other diabetic neurological complication: Secondary | ICD-10-CM | POA: Diagnosis not present

## 2013-07-10 DIAGNOSIS — E1142 Type 2 diabetes mellitus with diabetic polyneuropathy: Secondary | ICD-10-CM | POA: Diagnosis not present

## 2013-07-10 DIAGNOSIS — E7800 Pure hypercholesterolemia, unspecified: Secondary | ICD-10-CM | POA: Diagnosis not present

## 2013-07-10 DIAGNOSIS — E119 Type 2 diabetes mellitus without complications: Secondary | ICD-10-CM | POA: Diagnosis not present

## 2013-07-10 DIAGNOSIS — Z23 Encounter for immunization: Secondary | ICD-10-CM | POA: Diagnosis not present

## 2013-07-10 DIAGNOSIS — M109 Gout, unspecified: Secondary | ICD-10-CM | POA: Diagnosis not present

## 2013-07-10 DIAGNOSIS — Z79899 Other long term (current) drug therapy: Secondary | ICD-10-CM | POA: Diagnosis not present

## 2013-07-14 DIAGNOSIS — H524 Presbyopia: Secondary | ICD-10-CM | POA: Insufficient documentation

## 2013-07-14 DIAGNOSIS — H5200 Hypermetropia, unspecified eye: Secondary | ICD-10-CM | POA: Insufficient documentation

## 2013-07-14 DIAGNOSIS — M25559 Pain in unspecified hip: Secondary | ICD-10-CM | POA: Diagnosis not present

## 2013-07-14 DIAGNOSIS — H2510 Age-related nuclear cataract, unspecified eye: Secondary | ICD-10-CM | POA: Insufficient documentation

## 2013-07-14 DIAGNOSIS — H52229 Regular astigmatism, unspecified eye: Secondary | ICD-10-CM | POA: Insufficient documentation

## 2013-07-21 DIAGNOSIS — M6789 Other specified disorders of synovium and tendon, multiple sites: Secondary | ICD-10-CM | POA: Diagnosis not present

## 2013-07-21 DIAGNOSIS — M5137 Other intervertebral disc degeneration, lumbosacral region: Secondary | ICD-10-CM | POA: Diagnosis not present

## 2013-07-21 DIAGNOSIS — M25559 Pain in unspecified hip: Secondary | ICD-10-CM | POA: Diagnosis not present

## 2013-07-24 DIAGNOSIS — M25559 Pain in unspecified hip: Secondary | ICD-10-CM | POA: Diagnosis not present

## 2013-07-31 DIAGNOSIS — M169 Osteoarthritis of hip, unspecified: Secondary | ICD-10-CM | POA: Diagnosis not present

## 2013-08-10 DIAGNOSIS — Z0000 Encounter for general adult medical examination without abnormal findings: Secondary | ICD-10-CM | POA: Diagnosis not present

## 2013-08-10 DIAGNOSIS — Z01818 Encounter for other preprocedural examination: Secondary | ICD-10-CM | POA: Diagnosis not present

## 2013-08-21 DIAGNOSIS — Z01810 Encounter for preprocedural cardiovascular examination: Secondary | ICD-10-CM | POA: Diagnosis not present

## 2013-08-21 DIAGNOSIS — Z96619 Presence of unspecified artificial shoulder joint: Secondary | ICD-10-CM | POA: Diagnosis not present

## 2013-08-21 DIAGNOSIS — Z471 Aftercare following joint replacement surgery: Secondary | ICD-10-CM | POA: Diagnosis not present

## 2013-08-21 DIAGNOSIS — Z01818 Encounter for other preprocedural examination: Secondary | ICD-10-CM | POA: Diagnosis not present

## 2013-08-21 DIAGNOSIS — Z0189 Encounter for other specified special examinations: Secondary | ICD-10-CM | POA: Diagnosis not present

## 2013-08-28 DIAGNOSIS — Z880 Allergy status to penicillin: Secondary | ICD-10-CM | POA: Diagnosis not present

## 2013-08-28 DIAGNOSIS — N289 Disorder of kidney and ureter, unspecified: Secondary | ICD-10-CM | POA: Diagnosis present

## 2013-08-28 DIAGNOSIS — E669 Obesity, unspecified: Secondary | ICD-10-CM | POA: Diagnosis present

## 2013-08-28 DIAGNOSIS — G4733 Obstructive sleep apnea (adult) (pediatric): Secondary | ICD-10-CM | POA: Diagnosis present

## 2013-08-28 DIAGNOSIS — N400 Enlarged prostate without lower urinary tract symptoms: Secondary | ICD-10-CM | POA: Diagnosis present

## 2013-08-28 DIAGNOSIS — Z86711 Personal history of pulmonary embolism: Secondary | ICD-10-CM | POA: Diagnosis not present

## 2013-08-28 DIAGNOSIS — Z6835 Body mass index (BMI) 35.0-35.9, adult: Secondary | ICD-10-CM | POA: Diagnosis not present

## 2013-08-28 DIAGNOSIS — Z96649 Presence of unspecified artificial hip joint: Secondary | ICD-10-CM | POA: Insufficient documentation

## 2013-08-28 DIAGNOSIS — E119 Type 2 diabetes mellitus without complications: Secondary | ICD-10-CM | POA: Diagnosis present

## 2013-08-28 DIAGNOSIS — Z7982 Long term (current) use of aspirin: Secondary | ICD-10-CM | POA: Diagnosis not present

## 2013-08-28 DIAGNOSIS — Z96659 Presence of unspecified artificial knee joint: Secondary | ICD-10-CM | POA: Diagnosis not present

## 2013-08-28 DIAGNOSIS — Z882 Allergy status to sulfonamides status: Secondary | ICD-10-CM | POA: Diagnosis not present

## 2013-08-28 DIAGNOSIS — Z79899 Other long term (current) drug therapy: Secondary | ICD-10-CM | POA: Diagnosis not present

## 2013-08-28 DIAGNOSIS — Z471 Aftercare following joint replacement surgery: Secondary | ICD-10-CM | POA: Diagnosis not present

## 2013-08-28 DIAGNOSIS — M1610 Unilateral primary osteoarthritis, unspecified hip: Secondary | ICD-10-CM | POA: Diagnosis not present

## 2013-08-28 DIAGNOSIS — Z8614 Personal history of Methicillin resistant Staphylococcus aureus infection: Secondary | ICD-10-CM | POA: Diagnosis not present

## 2013-08-28 DIAGNOSIS — M109 Gout, unspecified: Secondary | ICD-10-CM | POA: Diagnosis present

## 2013-08-28 DIAGNOSIS — I10 Essential (primary) hypertension: Secondary | ICD-10-CM | POA: Diagnosis present

## 2013-08-28 DIAGNOSIS — M169 Osteoarthritis of hip, unspecified: Secondary | ICD-10-CM | POA: Insufficient documentation

## 2013-08-30 DIAGNOSIS — Z471 Aftercare following joint replacement surgery: Secondary | ICD-10-CM | POA: Diagnosis not present

## 2013-08-30 DIAGNOSIS — IMO0001 Reserved for inherently not codable concepts without codable children: Secondary | ICD-10-CM | POA: Diagnosis not present

## 2013-09-02 DIAGNOSIS — Z471 Aftercare following joint replacement surgery: Secondary | ICD-10-CM | POA: Diagnosis not present

## 2013-09-02 DIAGNOSIS — IMO0001 Reserved for inherently not codable concepts without codable children: Secondary | ICD-10-CM | POA: Diagnosis not present

## 2013-09-04 DIAGNOSIS — Z471 Aftercare following joint replacement surgery: Secondary | ICD-10-CM | POA: Diagnosis not present

## 2013-09-04 DIAGNOSIS — IMO0001 Reserved for inherently not codable concepts without codable children: Secondary | ICD-10-CM | POA: Diagnosis not present

## 2013-09-07 DIAGNOSIS — IMO0001 Reserved for inherently not codable concepts without codable children: Secondary | ICD-10-CM | POA: Diagnosis not present

## 2013-09-07 DIAGNOSIS — Z471 Aftercare following joint replacement surgery: Secondary | ICD-10-CM | POA: Diagnosis not present

## 2013-09-09 DIAGNOSIS — IMO0001 Reserved for inherently not codable concepts without codable children: Secondary | ICD-10-CM | POA: Diagnosis not present

## 2013-09-09 DIAGNOSIS — Z471 Aftercare following joint replacement surgery: Secondary | ICD-10-CM | POA: Diagnosis not present

## 2013-09-11 DIAGNOSIS — IMO0001 Reserved for inherently not codable concepts without codable children: Secondary | ICD-10-CM | POA: Diagnosis not present

## 2013-09-11 DIAGNOSIS — Z471 Aftercare following joint replacement surgery: Secondary | ICD-10-CM | POA: Diagnosis not present

## 2013-09-14 DIAGNOSIS — N401 Enlarged prostate with lower urinary tract symptoms: Secondary | ICD-10-CM | POA: Diagnosis not present

## 2013-09-14 DIAGNOSIS — R351 Nocturia: Secondary | ICD-10-CM | POA: Diagnosis not present

## 2013-09-14 DIAGNOSIS — IMO0001 Reserved for inherently not codable concepts without codable children: Secondary | ICD-10-CM | POA: Diagnosis not present

## 2013-09-14 DIAGNOSIS — Z125 Encounter for screening for malignant neoplasm of prostate: Secondary | ICD-10-CM | POA: Diagnosis not present

## 2013-09-14 DIAGNOSIS — Z471 Aftercare following joint replacement surgery: Secondary | ICD-10-CM | POA: Diagnosis not present

## 2013-09-16 DIAGNOSIS — Z471 Aftercare following joint replacement surgery: Secondary | ICD-10-CM | POA: Diagnosis not present

## 2013-09-16 DIAGNOSIS — IMO0001 Reserved for inherently not codable concepts without codable children: Secondary | ICD-10-CM | POA: Diagnosis not present

## 2013-09-18 DIAGNOSIS — Z471 Aftercare following joint replacement surgery: Secondary | ICD-10-CM | POA: Diagnosis not present

## 2013-09-18 DIAGNOSIS — IMO0001 Reserved for inherently not codable concepts without codable children: Secondary | ICD-10-CM | POA: Diagnosis not present

## 2013-09-21 DIAGNOSIS — IMO0001 Reserved for inherently not codable concepts without codable children: Secondary | ICD-10-CM | POA: Diagnosis not present

## 2013-09-21 DIAGNOSIS — Z471 Aftercare following joint replacement surgery: Secondary | ICD-10-CM | POA: Diagnosis not present

## 2013-09-23 DIAGNOSIS — Z471 Aftercare following joint replacement surgery: Secondary | ICD-10-CM | POA: Diagnosis not present

## 2013-09-23 DIAGNOSIS — IMO0001 Reserved for inherently not codable concepts without codable children: Secondary | ICD-10-CM | POA: Diagnosis not present

## 2013-09-24 DIAGNOSIS — IMO0001 Reserved for inherently not codable concepts without codable children: Secondary | ICD-10-CM | POA: Diagnosis not present

## 2013-09-24 DIAGNOSIS — Z471 Aftercare following joint replacement surgery: Secondary | ICD-10-CM | POA: Diagnosis not present

## 2013-09-25 DIAGNOSIS — Z471 Aftercare following joint replacement surgery: Secondary | ICD-10-CM | POA: Diagnosis not present

## 2013-09-25 DIAGNOSIS — Z96649 Presence of unspecified artificial hip joint: Secondary | ICD-10-CM | POA: Diagnosis not present

## 2013-11-10 DIAGNOSIS — Z471 Aftercare following joint replacement surgery: Secondary | ICD-10-CM | POA: Diagnosis not present

## 2013-11-10 DIAGNOSIS — Z96649 Presence of unspecified artificial hip joint: Secondary | ICD-10-CM | POA: Diagnosis not present

## 2014-03-18 DIAGNOSIS — N138 Other obstructive and reflux uropathy: Secondary | ICD-10-CM | POA: Diagnosis not present

## 2014-03-18 DIAGNOSIS — R351 Nocturia: Secondary | ICD-10-CM | POA: Diagnosis not present

## 2014-03-18 DIAGNOSIS — N401 Enlarged prostate with lower urinary tract symptoms: Secondary | ICD-10-CM | POA: Diagnosis not present

## 2014-06-28 DIAGNOSIS — T148XXA Other injury of unspecified body region, initial encounter: Secondary | ICD-10-CM | POA: Diagnosis not present

## 2014-09-20 DIAGNOSIS — N401 Enlarged prostate with lower urinary tract symptoms: Secondary | ICD-10-CM | POA: Diagnosis not present

## 2014-09-20 DIAGNOSIS — Z125 Encounter for screening for malignant neoplasm of prostate: Secondary | ICD-10-CM | POA: Diagnosis not present

## 2014-09-20 DIAGNOSIS — R351 Nocturia: Secondary | ICD-10-CM | POA: Diagnosis not present

## 2014-10-25 DIAGNOSIS — E1165 Type 2 diabetes mellitus with hyperglycemia: Secondary | ICD-10-CM | POA: Diagnosis not present

## 2014-10-25 DIAGNOSIS — E1169 Type 2 diabetes mellitus with other specified complication: Secondary | ICD-10-CM | POA: Diagnosis not present

## 2014-10-25 DIAGNOSIS — Z09 Encounter for follow-up examination after completed treatment for conditions other than malignant neoplasm: Secondary | ICD-10-CM | POA: Diagnosis not present

## 2014-10-25 DIAGNOSIS — E785 Hyperlipidemia, unspecified: Secondary | ICD-10-CM | POA: Diagnosis not present

## 2014-10-25 DIAGNOSIS — I10 Essential (primary) hypertension: Secondary | ICD-10-CM | POA: Diagnosis not present

## 2015-03-22 DIAGNOSIS — N401 Enlarged prostate with lower urinary tract symptoms: Secondary | ICD-10-CM | POA: Diagnosis not present

## 2015-03-22 DIAGNOSIS — R351 Nocturia: Secondary | ICD-10-CM | POA: Diagnosis not present

## 2015-04-14 DIAGNOSIS — M5441 Lumbago with sciatica, right side: Secondary | ICD-10-CM | POA: Diagnosis not present

## 2015-04-14 DIAGNOSIS — M9903 Segmental and somatic dysfunction of lumbar region: Secondary | ICD-10-CM | POA: Diagnosis not present

## 2015-04-14 DIAGNOSIS — M5033 Other cervical disc degeneration, cervicothoracic region: Secondary | ICD-10-CM | POA: Diagnosis not present

## 2015-04-14 DIAGNOSIS — M5137 Other intervertebral disc degeneration, lumbosacral region: Secondary | ICD-10-CM | POA: Diagnosis not present

## 2015-04-14 DIAGNOSIS — M542 Cervicalgia: Secondary | ICD-10-CM | POA: Diagnosis not present

## 2015-04-14 DIAGNOSIS — M9902 Segmental and somatic dysfunction of thoracic region: Secondary | ICD-10-CM | POA: Diagnosis not present

## 2015-04-14 DIAGNOSIS — M9901 Segmental and somatic dysfunction of cervical region: Secondary | ICD-10-CM | POA: Diagnosis not present

## 2015-04-15 DIAGNOSIS — M5137 Other intervertebral disc degeneration, lumbosacral region: Secondary | ICD-10-CM | POA: Diagnosis not present

## 2015-04-15 DIAGNOSIS — M9902 Segmental and somatic dysfunction of thoracic region: Secondary | ICD-10-CM | POA: Diagnosis not present

## 2015-04-15 DIAGNOSIS — M5441 Lumbago with sciatica, right side: Secondary | ICD-10-CM | POA: Diagnosis not present

## 2015-04-15 DIAGNOSIS — M5033 Other cervical disc degeneration, cervicothoracic region: Secondary | ICD-10-CM | POA: Diagnosis not present

## 2015-04-15 DIAGNOSIS — M9903 Segmental and somatic dysfunction of lumbar region: Secondary | ICD-10-CM | POA: Diagnosis not present

## 2015-04-15 DIAGNOSIS — M542 Cervicalgia: Secondary | ICD-10-CM | POA: Diagnosis not present

## 2015-04-15 DIAGNOSIS — M9901 Segmental and somatic dysfunction of cervical region: Secondary | ICD-10-CM | POA: Diagnosis not present

## 2015-04-18 DIAGNOSIS — M5137 Other intervertebral disc degeneration, lumbosacral region: Secondary | ICD-10-CM | POA: Diagnosis not present

## 2015-04-18 DIAGNOSIS — M9901 Segmental and somatic dysfunction of cervical region: Secondary | ICD-10-CM | POA: Diagnosis not present

## 2015-04-18 DIAGNOSIS — M9903 Segmental and somatic dysfunction of lumbar region: Secondary | ICD-10-CM | POA: Diagnosis not present

## 2015-04-18 DIAGNOSIS — M542 Cervicalgia: Secondary | ICD-10-CM | POA: Diagnosis not present

## 2015-04-18 DIAGNOSIS — M9902 Segmental and somatic dysfunction of thoracic region: Secondary | ICD-10-CM | POA: Diagnosis not present

## 2015-04-18 DIAGNOSIS — M5441 Lumbago with sciatica, right side: Secondary | ICD-10-CM | POA: Diagnosis not present

## 2015-04-18 DIAGNOSIS — M5033 Other cervical disc degeneration, cervicothoracic region: Secondary | ICD-10-CM | POA: Diagnosis not present

## 2015-04-21 DIAGNOSIS — M542 Cervicalgia: Secondary | ICD-10-CM | POA: Diagnosis not present

## 2015-04-21 DIAGNOSIS — M5441 Lumbago with sciatica, right side: Secondary | ICD-10-CM | POA: Diagnosis not present

## 2015-04-21 DIAGNOSIS — M5033 Other cervical disc degeneration, cervicothoracic region: Secondary | ICD-10-CM | POA: Diagnosis not present

## 2015-04-21 DIAGNOSIS — M9901 Segmental and somatic dysfunction of cervical region: Secondary | ICD-10-CM | POA: Diagnosis not present

## 2015-04-21 DIAGNOSIS — M9902 Segmental and somatic dysfunction of thoracic region: Secondary | ICD-10-CM | POA: Diagnosis not present

## 2015-04-21 DIAGNOSIS — M9903 Segmental and somatic dysfunction of lumbar region: Secondary | ICD-10-CM | POA: Diagnosis not present

## 2015-04-21 DIAGNOSIS — M5137 Other intervertebral disc degeneration, lumbosacral region: Secondary | ICD-10-CM | POA: Diagnosis not present

## 2015-04-27 DIAGNOSIS — M5137 Other intervertebral disc degeneration, lumbosacral region: Secondary | ICD-10-CM | POA: Diagnosis not present

## 2015-04-27 DIAGNOSIS — M542 Cervicalgia: Secondary | ICD-10-CM | POA: Diagnosis not present

## 2015-04-27 DIAGNOSIS — M5441 Lumbago with sciatica, right side: Secondary | ICD-10-CM | POA: Diagnosis not present

## 2015-04-27 DIAGNOSIS — M9902 Segmental and somatic dysfunction of thoracic region: Secondary | ICD-10-CM | POA: Diagnosis not present

## 2015-04-27 DIAGNOSIS — M9903 Segmental and somatic dysfunction of lumbar region: Secondary | ICD-10-CM | POA: Diagnosis not present

## 2015-04-27 DIAGNOSIS — M9901 Segmental and somatic dysfunction of cervical region: Secondary | ICD-10-CM | POA: Diagnosis not present

## 2015-04-27 DIAGNOSIS — M5033 Other cervical disc degeneration, cervicothoracic region: Secondary | ICD-10-CM | POA: Diagnosis not present

## 2015-05-02 DIAGNOSIS — M9903 Segmental and somatic dysfunction of lumbar region: Secondary | ICD-10-CM | POA: Diagnosis not present

## 2015-05-02 DIAGNOSIS — M542 Cervicalgia: Secondary | ICD-10-CM | POA: Diagnosis not present

## 2015-05-02 DIAGNOSIS — M9901 Segmental and somatic dysfunction of cervical region: Secondary | ICD-10-CM | POA: Diagnosis not present

## 2015-05-02 DIAGNOSIS — M5033 Other cervical disc degeneration, cervicothoracic region: Secondary | ICD-10-CM | POA: Diagnosis not present

## 2015-05-02 DIAGNOSIS — M5441 Lumbago with sciatica, right side: Secondary | ICD-10-CM | POA: Diagnosis not present

## 2015-05-02 DIAGNOSIS — M9902 Segmental and somatic dysfunction of thoracic region: Secondary | ICD-10-CM | POA: Diagnosis not present

## 2015-05-02 DIAGNOSIS — M5137 Other intervertebral disc degeneration, lumbosacral region: Secondary | ICD-10-CM | POA: Diagnosis not present

## 2015-05-05 DIAGNOSIS — M5033 Other cervical disc degeneration, cervicothoracic region: Secondary | ICD-10-CM | POA: Diagnosis not present

## 2015-05-05 DIAGNOSIS — M9903 Segmental and somatic dysfunction of lumbar region: Secondary | ICD-10-CM | POA: Diagnosis not present

## 2015-05-05 DIAGNOSIS — M5137 Other intervertebral disc degeneration, lumbosacral region: Secondary | ICD-10-CM | POA: Diagnosis not present

## 2015-05-05 DIAGNOSIS — M9901 Segmental and somatic dysfunction of cervical region: Secondary | ICD-10-CM | POA: Diagnosis not present

## 2015-05-05 DIAGNOSIS — M5441 Lumbago with sciatica, right side: Secondary | ICD-10-CM | POA: Diagnosis not present

## 2015-05-05 DIAGNOSIS — M9902 Segmental and somatic dysfunction of thoracic region: Secondary | ICD-10-CM | POA: Diagnosis not present

## 2015-05-05 DIAGNOSIS — M542 Cervicalgia: Secondary | ICD-10-CM | POA: Diagnosis not present

## 2015-05-06 DIAGNOSIS — M9901 Segmental and somatic dysfunction of cervical region: Secondary | ICD-10-CM | POA: Diagnosis not present

## 2015-05-06 DIAGNOSIS — M5441 Lumbago with sciatica, right side: Secondary | ICD-10-CM | POA: Diagnosis not present

## 2015-05-06 DIAGNOSIS — M542 Cervicalgia: Secondary | ICD-10-CM | POA: Diagnosis not present

## 2015-05-06 DIAGNOSIS — M5033 Other cervical disc degeneration, cervicothoracic region: Secondary | ICD-10-CM | POA: Diagnosis not present

## 2015-05-06 DIAGNOSIS — M9902 Segmental and somatic dysfunction of thoracic region: Secondary | ICD-10-CM | POA: Diagnosis not present

## 2015-05-06 DIAGNOSIS — M9903 Segmental and somatic dysfunction of lumbar region: Secondary | ICD-10-CM | POA: Diagnosis not present

## 2015-05-06 DIAGNOSIS — M5137 Other intervertebral disc degeneration, lumbosacral region: Secondary | ICD-10-CM | POA: Diagnosis not present

## 2015-05-09 DIAGNOSIS — M5441 Lumbago with sciatica, right side: Secondary | ICD-10-CM | POA: Diagnosis not present

## 2015-05-09 DIAGNOSIS — M9902 Segmental and somatic dysfunction of thoracic region: Secondary | ICD-10-CM | POA: Diagnosis not present

## 2015-05-09 DIAGNOSIS — M9903 Segmental and somatic dysfunction of lumbar region: Secondary | ICD-10-CM | POA: Diagnosis not present

## 2015-05-09 DIAGNOSIS — M5033 Other cervical disc degeneration, cervicothoracic region: Secondary | ICD-10-CM | POA: Diagnosis not present

## 2015-05-09 DIAGNOSIS — M9901 Segmental and somatic dysfunction of cervical region: Secondary | ICD-10-CM | POA: Diagnosis not present

## 2015-05-09 DIAGNOSIS — M5137 Other intervertebral disc degeneration, lumbosacral region: Secondary | ICD-10-CM | POA: Diagnosis not present

## 2015-05-09 DIAGNOSIS — M542 Cervicalgia: Secondary | ICD-10-CM | POA: Diagnosis not present

## 2015-05-11 DIAGNOSIS — M542 Cervicalgia: Secondary | ICD-10-CM | POA: Diagnosis not present

## 2015-05-11 DIAGNOSIS — M9901 Segmental and somatic dysfunction of cervical region: Secondary | ICD-10-CM | POA: Diagnosis not present

## 2015-05-11 DIAGNOSIS — M5137 Other intervertebral disc degeneration, lumbosacral region: Secondary | ICD-10-CM | POA: Diagnosis not present

## 2015-05-11 DIAGNOSIS — M9903 Segmental and somatic dysfunction of lumbar region: Secondary | ICD-10-CM | POA: Diagnosis not present

## 2015-05-11 DIAGNOSIS — M5441 Lumbago with sciatica, right side: Secondary | ICD-10-CM | POA: Diagnosis not present

## 2015-05-11 DIAGNOSIS — M5033 Other cervical disc degeneration, cervicothoracic region: Secondary | ICD-10-CM | POA: Diagnosis not present

## 2015-05-11 DIAGNOSIS — M9902 Segmental and somatic dysfunction of thoracic region: Secondary | ICD-10-CM | POA: Diagnosis not present

## 2015-05-13 DIAGNOSIS — M5441 Lumbago with sciatica, right side: Secondary | ICD-10-CM | POA: Diagnosis not present

## 2015-05-13 DIAGNOSIS — M9902 Segmental and somatic dysfunction of thoracic region: Secondary | ICD-10-CM | POA: Diagnosis not present

## 2015-05-13 DIAGNOSIS — M9903 Segmental and somatic dysfunction of lumbar region: Secondary | ICD-10-CM | POA: Diagnosis not present

## 2015-05-13 DIAGNOSIS — M5137 Other intervertebral disc degeneration, lumbosacral region: Secondary | ICD-10-CM | POA: Diagnosis not present

## 2015-05-13 DIAGNOSIS — M542 Cervicalgia: Secondary | ICD-10-CM | POA: Diagnosis not present

## 2015-05-13 DIAGNOSIS — M5033 Other cervical disc degeneration, cervicothoracic region: Secondary | ICD-10-CM | POA: Diagnosis not present

## 2015-05-13 DIAGNOSIS — M9901 Segmental and somatic dysfunction of cervical region: Secondary | ICD-10-CM | POA: Diagnosis not present

## 2015-05-16 DIAGNOSIS — M9901 Segmental and somatic dysfunction of cervical region: Secondary | ICD-10-CM | POA: Diagnosis not present

## 2015-05-16 DIAGNOSIS — M5441 Lumbago with sciatica, right side: Secondary | ICD-10-CM | POA: Diagnosis not present

## 2015-05-16 DIAGNOSIS — M5033 Other cervical disc degeneration, cervicothoracic region: Secondary | ICD-10-CM | POA: Diagnosis not present

## 2015-05-16 DIAGNOSIS — M9903 Segmental and somatic dysfunction of lumbar region: Secondary | ICD-10-CM | POA: Diagnosis not present

## 2015-05-16 DIAGNOSIS — M5137 Other intervertebral disc degeneration, lumbosacral region: Secondary | ICD-10-CM | POA: Diagnosis not present

## 2015-05-16 DIAGNOSIS — M9902 Segmental and somatic dysfunction of thoracic region: Secondary | ICD-10-CM | POA: Diagnosis not present

## 2015-05-16 DIAGNOSIS — M542 Cervicalgia: Secondary | ICD-10-CM | POA: Diagnosis not present

## 2015-05-18 DIAGNOSIS — M542 Cervicalgia: Secondary | ICD-10-CM | POA: Diagnosis not present

## 2015-05-18 DIAGNOSIS — M5441 Lumbago with sciatica, right side: Secondary | ICD-10-CM | POA: Diagnosis not present

## 2015-05-18 DIAGNOSIS — M9901 Segmental and somatic dysfunction of cervical region: Secondary | ICD-10-CM | POA: Diagnosis not present

## 2015-05-18 DIAGNOSIS — M5033 Other cervical disc degeneration, cervicothoracic region: Secondary | ICD-10-CM | POA: Diagnosis not present

## 2015-05-18 DIAGNOSIS — M9903 Segmental and somatic dysfunction of lumbar region: Secondary | ICD-10-CM | POA: Diagnosis not present

## 2015-05-18 DIAGNOSIS — M9902 Segmental and somatic dysfunction of thoracic region: Secondary | ICD-10-CM | POA: Diagnosis not present

## 2015-05-18 DIAGNOSIS — M5137 Other intervertebral disc degeneration, lumbosacral region: Secondary | ICD-10-CM | POA: Diagnosis not present

## 2015-05-20 DIAGNOSIS — M9902 Segmental and somatic dysfunction of thoracic region: Secondary | ICD-10-CM | POA: Diagnosis not present

## 2015-05-20 DIAGNOSIS — M542 Cervicalgia: Secondary | ICD-10-CM | POA: Diagnosis not present

## 2015-05-20 DIAGNOSIS — M5033 Other cervical disc degeneration, cervicothoracic region: Secondary | ICD-10-CM | POA: Diagnosis not present

## 2015-05-20 DIAGNOSIS — M5137 Other intervertebral disc degeneration, lumbosacral region: Secondary | ICD-10-CM | POA: Diagnosis not present

## 2015-05-20 DIAGNOSIS — M9903 Segmental and somatic dysfunction of lumbar region: Secondary | ICD-10-CM | POA: Diagnosis not present

## 2015-05-20 DIAGNOSIS — M5441 Lumbago with sciatica, right side: Secondary | ICD-10-CM | POA: Diagnosis not present

## 2015-05-20 DIAGNOSIS — M9901 Segmental and somatic dysfunction of cervical region: Secondary | ICD-10-CM | POA: Diagnosis not present

## 2015-05-23 DIAGNOSIS — M5033 Other cervical disc degeneration, cervicothoracic region: Secondary | ICD-10-CM | POA: Diagnosis not present

## 2015-05-23 DIAGNOSIS — M5137 Other intervertebral disc degeneration, lumbosacral region: Secondary | ICD-10-CM | POA: Diagnosis not present

## 2015-05-23 DIAGNOSIS — M5441 Lumbago with sciatica, right side: Secondary | ICD-10-CM | POA: Diagnosis not present

## 2015-05-23 DIAGNOSIS — M542 Cervicalgia: Secondary | ICD-10-CM | POA: Diagnosis not present

## 2015-05-23 DIAGNOSIS — M9901 Segmental and somatic dysfunction of cervical region: Secondary | ICD-10-CM | POA: Diagnosis not present

## 2015-05-23 DIAGNOSIS — M9903 Segmental and somatic dysfunction of lumbar region: Secondary | ICD-10-CM | POA: Diagnosis not present

## 2015-05-23 DIAGNOSIS — M9902 Segmental and somatic dysfunction of thoracic region: Secondary | ICD-10-CM | POA: Diagnosis not present

## 2015-05-25 DIAGNOSIS — M9902 Segmental and somatic dysfunction of thoracic region: Secondary | ICD-10-CM | POA: Diagnosis not present

## 2015-05-25 DIAGNOSIS — M9901 Segmental and somatic dysfunction of cervical region: Secondary | ICD-10-CM | POA: Diagnosis not present

## 2015-05-25 DIAGNOSIS — M5033 Other cervical disc degeneration, cervicothoracic region: Secondary | ICD-10-CM | POA: Diagnosis not present

## 2015-05-25 DIAGNOSIS — M5137 Other intervertebral disc degeneration, lumbosacral region: Secondary | ICD-10-CM | POA: Diagnosis not present

## 2015-05-25 DIAGNOSIS — M5441 Lumbago with sciatica, right side: Secondary | ICD-10-CM | POA: Diagnosis not present

## 2015-05-25 DIAGNOSIS — M9903 Segmental and somatic dysfunction of lumbar region: Secondary | ICD-10-CM | POA: Diagnosis not present

## 2015-05-25 DIAGNOSIS — M542 Cervicalgia: Secondary | ICD-10-CM | POA: Diagnosis not present

## 2015-05-27 DIAGNOSIS — M9901 Segmental and somatic dysfunction of cervical region: Secondary | ICD-10-CM | POA: Diagnosis not present

## 2015-05-27 DIAGNOSIS — M5441 Lumbago with sciatica, right side: Secondary | ICD-10-CM | POA: Diagnosis not present

## 2015-05-27 DIAGNOSIS — M542 Cervicalgia: Secondary | ICD-10-CM | POA: Diagnosis not present

## 2015-05-27 DIAGNOSIS — M9902 Segmental and somatic dysfunction of thoracic region: Secondary | ICD-10-CM | POA: Diagnosis not present

## 2015-05-27 DIAGNOSIS — M5137 Other intervertebral disc degeneration, lumbosacral region: Secondary | ICD-10-CM | POA: Diagnosis not present

## 2015-05-27 DIAGNOSIS — M9903 Segmental and somatic dysfunction of lumbar region: Secondary | ICD-10-CM | POA: Diagnosis not present

## 2015-05-27 DIAGNOSIS — M5033 Other cervical disc degeneration, cervicothoracic region: Secondary | ICD-10-CM | POA: Diagnosis not present

## 2015-05-30 DIAGNOSIS — M542 Cervicalgia: Secondary | ICD-10-CM | POA: Diagnosis not present

## 2015-05-30 DIAGNOSIS — M9903 Segmental and somatic dysfunction of lumbar region: Secondary | ICD-10-CM | POA: Diagnosis not present

## 2015-05-30 DIAGNOSIS — M5137 Other intervertebral disc degeneration, lumbosacral region: Secondary | ICD-10-CM | POA: Diagnosis not present

## 2015-05-30 DIAGNOSIS — M5033 Other cervical disc degeneration, cervicothoracic region: Secondary | ICD-10-CM | POA: Diagnosis not present

## 2015-05-30 DIAGNOSIS — M9902 Segmental and somatic dysfunction of thoracic region: Secondary | ICD-10-CM | POA: Diagnosis not present

## 2015-05-30 DIAGNOSIS — M9901 Segmental and somatic dysfunction of cervical region: Secondary | ICD-10-CM | POA: Diagnosis not present

## 2015-05-30 DIAGNOSIS — M5441 Lumbago with sciatica, right side: Secondary | ICD-10-CM | POA: Diagnosis not present

## 2015-06-01 DIAGNOSIS — M5033 Other cervical disc degeneration, cervicothoracic region: Secondary | ICD-10-CM | POA: Diagnosis not present

## 2015-06-01 DIAGNOSIS — M9902 Segmental and somatic dysfunction of thoracic region: Secondary | ICD-10-CM | POA: Diagnosis not present

## 2015-06-01 DIAGNOSIS — M9901 Segmental and somatic dysfunction of cervical region: Secondary | ICD-10-CM | POA: Diagnosis not present

## 2015-06-01 DIAGNOSIS — M9903 Segmental and somatic dysfunction of lumbar region: Secondary | ICD-10-CM | POA: Diagnosis not present

## 2015-06-01 DIAGNOSIS — M5137 Other intervertebral disc degeneration, lumbosacral region: Secondary | ICD-10-CM | POA: Diagnosis not present

## 2015-06-01 DIAGNOSIS — M5441 Lumbago with sciatica, right side: Secondary | ICD-10-CM | POA: Diagnosis not present

## 2015-06-01 DIAGNOSIS — M542 Cervicalgia: Secondary | ICD-10-CM | POA: Diagnosis not present

## 2015-06-03 DIAGNOSIS — M9902 Segmental and somatic dysfunction of thoracic region: Secondary | ICD-10-CM | POA: Diagnosis not present

## 2015-06-03 DIAGNOSIS — M5441 Lumbago with sciatica, right side: Secondary | ICD-10-CM | POA: Diagnosis not present

## 2015-06-03 DIAGNOSIS — M9903 Segmental and somatic dysfunction of lumbar region: Secondary | ICD-10-CM | POA: Diagnosis not present

## 2015-06-03 DIAGNOSIS — M5033 Other cervical disc degeneration, cervicothoracic region: Secondary | ICD-10-CM | POA: Diagnosis not present

## 2015-06-03 DIAGNOSIS — M9901 Segmental and somatic dysfunction of cervical region: Secondary | ICD-10-CM | POA: Diagnosis not present

## 2015-06-03 DIAGNOSIS — M5137 Other intervertebral disc degeneration, lumbosacral region: Secondary | ICD-10-CM | POA: Diagnosis not present

## 2015-06-03 DIAGNOSIS — M542 Cervicalgia: Secondary | ICD-10-CM | POA: Diagnosis not present

## 2015-06-16 DIAGNOSIS — M5441 Lumbago with sciatica, right side: Secondary | ICD-10-CM | POA: Diagnosis not present

## 2015-06-16 DIAGNOSIS — M5033 Other cervical disc degeneration, cervicothoracic region: Secondary | ICD-10-CM | POA: Diagnosis not present

## 2015-06-16 DIAGNOSIS — M5137 Other intervertebral disc degeneration, lumbosacral region: Secondary | ICD-10-CM | POA: Diagnosis not present

## 2015-06-16 DIAGNOSIS — M542 Cervicalgia: Secondary | ICD-10-CM | POA: Diagnosis not present

## 2015-06-16 DIAGNOSIS — M9902 Segmental and somatic dysfunction of thoracic region: Secondary | ICD-10-CM | POA: Diagnosis not present

## 2015-06-16 DIAGNOSIS — M9901 Segmental and somatic dysfunction of cervical region: Secondary | ICD-10-CM | POA: Diagnosis not present

## 2015-06-16 DIAGNOSIS — M9903 Segmental and somatic dysfunction of lumbar region: Secondary | ICD-10-CM | POA: Diagnosis not present

## 2015-07-14 DIAGNOSIS — M542 Cervicalgia: Secondary | ICD-10-CM | POA: Diagnosis not present

## 2015-07-14 DIAGNOSIS — M5137 Other intervertebral disc degeneration, lumbosacral region: Secondary | ICD-10-CM | POA: Diagnosis not present

## 2015-07-14 DIAGNOSIS — M9902 Segmental and somatic dysfunction of thoracic region: Secondary | ICD-10-CM | POA: Diagnosis not present

## 2015-07-14 DIAGNOSIS — M5033 Other cervical disc degeneration, cervicothoracic region: Secondary | ICD-10-CM | POA: Diagnosis not present

## 2015-07-14 DIAGNOSIS — M9903 Segmental and somatic dysfunction of lumbar region: Secondary | ICD-10-CM | POA: Diagnosis not present

## 2015-07-14 DIAGNOSIS — M9901 Segmental and somatic dysfunction of cervical region: Secondary | ICD-10-CM | POA: Diagnosis not present

## 2015-07-14 DIAGNOSIS — M5441 Lumbago with sciatica, right side: Secondary | ICD-10-CM | POA: Diagnosis not present

## 2015-07-28 DIAGNOSIS — M9901 Segmental and somatic dysfunction of cervical region: Secondary | ICD-10-CM | POA: Diagnosis not present

## 2015-07-28 DIAGNOSIS — M5033 Other cervical disc degeneration, cervicothoracic region: Secondary | ICD-10-CM | POA: Diagnosis not present

## 2015-07-28 DIAGNOSIS — M9903 Segmental and somatic dysfunction of lumbar region: Secondary | ICD-10-CM | POA: Diagnosis not present

## 2015-07-28 DIAGNOSIS — M5441 Lumbago with sciatica, right side: Secondary | ICD-10-CM | POA: Diagnosis not present

## 2015-07-28 DIAGNOSIS — M5137 Other intervertebral disc degeneration, lumbosacral region: Secondary | ICD-10-CM | POA: Diagnosis not present

## 2015-07-28 DIAGNOSIS — M542 Cervicalgia: Secondary | ICD-10-CM | POA: Diagnosis not present

## 2015-07-28 DIAGNOSIS — M9902 Segmental and somatic dysfunction of thoracic region: Secondary | ICD-10-CM | POA: Diagnosis not present

## 2015-09-02 DIAGNOSIS — M5441 Lumbago with sciatica, right side: Secondary | ICD-10-CM | POA: Diagnosis not present

## 2015-09-02 DIAGNOSIS — Z23 Encounter for immunization: Secondary | ICD-10-CM | POA: Diagnosis not present

## 2015-09-02 DIAGNOSIS — M9902 Segmental and somatic dysfunction of thoracic region: Secondary | ICD-10-CM | POA: Diagnosis not present

## 2015-09-02 DIAGNOSIS — M9903 Segmental and somatic dysfunction of lumbar region: Secondary | ICD-10-CM | POA: Diagnosis not present

## 2015-09-02 DIAGNOSIS — M9901 Segmental and somatic dysfunction of cervical region: Secondary | ICD-10-CM | POA: Diagnosis not present

## 2015-09-02 DIAGNOSIS — M5033 Other cervical disc degeneration, cervicothoracic region: Secondary | ICD-10-CM | POA: Diagnosis not present

## 2015-09-02 DIAGNOSIS — M542 Cervicalgia: Secondary | ICD-10-CM | POA: Diagnosis not present

## 2015-09-02 DIAGNOSIS — M5137 Other intervertebral disc degeneration, lumbosacral region: Secondary | ICD-10-CM | POA: Diagnosis not present

## 2015-09-21 DIAGNOSIS — R339 Retention of urine, unspecified: Secondary | ICD-10-CM | POA: Diagnosis not present

## 2015-09-21 DIAGNOSIS — N401 Enlarged prostate with lower urinary tract symptoms: Secondary | ICD-10-CM | POA: Diagnosis not present

## 2015-09-21 DIAGNOSIS — R351 Nocturia: Secondary | ICD-10-CM | POA: Diagnosis not present

## 2015-09-21 DIAGNOSIS — Z125 Encounter for screening for malignant neoplasm of prostate: Secondary | ICD-10-CM | POA: Diagnosis not present

## 2015-09-30 DIAGNOSIS — M5033 Other cervical disc degeneration, cervicothoracic region: Secondary | ICD-10-CM | POA: Diagnosis not present

## 2015-09-30 DIAGNOSIS — M542 Cervicalgia: Secondary | ICD-10-CM | POA: Diagnosis not present

## 2015-09-30 DIAGNOSIS — M5137 Other intervertebral disc degeneration, lumbosacral region: Secondary | ICD-10-CM | POA: Diagnosis not present

## 2015-09-30 DIAGNOSIS — M9903 Segmental and somatic dysfunction of lumbar region: Secondary | ICD-10-CM | POA: Diagnosis not present

## 2015-09-30 DIAGNOSIS — M9902 Segmental and somatic dysfunction of thoracic region: Secondary | ICD-10-CM | POA: Diagnosis not present

## 2015-09-30 DIAGNOSIS — M5441 Lumbago with sciatica, right side: Secondary | ICD-10-CM | POA: Diagnosis not present

## 2015-09-30 DIAGNOSIS — M9901 Segmental and somatic dysfunction of cervical region: Secondary | ICD-10-CM | POA: Diagnosis not present

## 2015-10-26 DIAGNOSIS — E7800 Pure hypercholesterolemia, unspecified: Secondary | ICD-10-CM | POA: Diagnosis not present

## 2015-10-26 DIAGNOSIS — E669 Obesity, unspecified: Secondary | ICD-10-CM | POA: Diagnosis not present

## 2015-10-26 DIAGNOSIS — I10 Essential (primary) hypertension: Secondary | ICD-10-CM | POA: Diagnosis not present

## 2015-10-26 DIAGNOSIS — N400 Enlarged prostate without lower urinary tract symptoms: Secondary | ICD-10-CM | POA: Diagnosis not present

## 2016-01-28 DIAGNOSIS — M9701XD Periprosthetic fracture around internal prosthetic right hip joint, subsequent encounter: Secondary | ICD-10-CM | POA: Diagnosis not present

## 2016-01-28 DIAGNOSIS — M169 Osteoarthritis of hip, unspecified: Secondary | ICD-10-CM | POA: Diagnosis not present

## 2016-01-28 DIAGNOSIS — M9711XA Periprosthetic fracture around internal prosthetic right knee joint, initial encounter: Secondary | ICD-10-CM | POA: Diagnosis not present

## 2016-01-28 DIAGNOSIS — M25559 Pain in unspecified hip: Secondary | ICD-10-CM | POA: Diagnosis not present

## 2016-01-28 DIAGNOSIS — Z7409 Other reduced mobility: Secondary | ICD-10-CM | POA: Diagnosis not present

## 2016-01-28 DIAGNOSIS — S72401D Unspecified fracture of lower end of right femur, subsequent encounter for closed fracture with routine healing: Secondary | ICD-10-CM | POA: Diagnosis not present

## 2016-01-28 DIAGNOSIS — Z9989 Dependence on other enabling machines and devices: Secondary | ICD-10-CM | POA: Diagnosis not present

## 2016-01-28 DIAGNOSIS — E669 Obesity, unspecified: Secondary | ICD-10-CM | POA: Diagnosis not present

## 2016-01-28 DIAGNOSIS — Z79899 Other long term (current) drug therapy: Secondary | ICD-10-CM | POA: Diagnosis not present

## 2016-01-28 DIAGNOSIS — S6991XA Unspecified injury of right wrist, hand and finger(s), initial encounter: Secondary | ICD-10-CM | POA: Diagnosis not present

## 2016-01-28 DIAGNOSIS — E119 Type 2 diabetes mellitus without complications: Secondary | ICD-10-CM | POA: Diagnosis not present

## 2016-01-28 DIAGNOSIS — I10 Essential (primary) hypertension: Secondary | ICD-10-CM | POA: Diagnosis not present

## 2016-01-28 DIAGNOSIS — M109 Gout, unspecified: Secondary | ICD-10-CM | POA: Diagnosis not present

## 2016-01-28 DIAGNOSIS — Z96611 Presence of right artificial shoulder joint: Secondary | ICD-10-CM | POA: Diagnosis present

## 2016-01-28 DIAGNOSIS — S61411A Laceration without foreign body of right hand, initial encounter: Secondary | ICD-10-CM | POA: Diagnosis not present

## 2016-01-28 DIAGNOSIS — Z96641 Presence of right artificial hip joint: Secondary | ICD-10-CM | POA: Diagnosis not present

## 2016-01-28 DIAGNOSIS — D62 Acute posthemorrhagic anemia: Secondary | ICD-10-CM | POA: Diagnosis not present

## 2016-01-28 DIAGNOSIS — S0990XA Unspecified injury of head, initial encounter: Secondary | ICD-10-CM | POA: Diagnosis not present

## 2016-01-28 DIAGNOSIS — Z886 Allergy status to analgesic agent status: Secondary | ICD-10-CM | POA: Diagnosis not present

## 2016-01-28 DIAGNOSIS — R259 Unspecified abnormal involuntary movements: Secondary | ICD-10-CM | POA: Diagnosis not present

## 2016-01-28 DIAGNOSIS — M978XXA Periprosthetic fracture around other internal prosthetic joint, initial encounter: Secondary | ICD-10-CM | POA: Insufficient documentation

## 2016-01-28 DIAGNOSIS — Z882 Allergy status to sulfonamides status: Secondary | ICD-10-CM | POA: Diagnosis not present

## 2016-01-28 DIAGNOSIS — S72331A Displaced oblique fracture of shaft of right femur, initial encounter for closed fracture: Secondary | ICD-10-CM | POA: Diagnosis not present

## 2016-01-28 DIAGNOSIS — S72341A Displaced spiral fracture of shaft of right femur, initial encounter for closed fracture: Secondary | ICD-10-CM | POA: Diagnosis not present

## 2016-01-28 DIAGNOSIS — R140 Abdominal distension (gaseous): Secondary | ICD-10-CM | POA: Diagnosis not present

## 2016-01-28 DIAGNOSIS — Z7984 Long term (current) use of oral hypoglycemic drugs: Secondary | ICD-10-CM | POA: Diagnosis not present

## 2016-01-28 DIAGNOSIS — Z880 Allergy status to penicillin: Secondary | ICD-10-CM | POA: Diagnosis not present

## 2016-01-28 DIAGNOSIS — Z96612 Presence of left artificial shoulder joint: Secondary | ICD-10-CM | POA: Diagnosis not present

## 2016-01-28 DIAGNOSIS — S72351A Displaced comminuted fracture of shaft of right femur, initial encounter for closed fracture: Secondary | ICD-10-CM | POA: Diagnosis not present

## 2016-01-28 DIAGNOSIS — T148 Other injury of unspecified body region: Secondary | ICD-10-CM | POA: Diagnosis not present

## 2016-01-28 DIAGNOSIS — K5900 Constipation, unspecified: Secondary | ICD-10-CM | POA: Diagnosis not present

## 2016-01-28 DIAGNOSIS — Z7982 Long term (current) use of aspirin: Secondary | ICD-10-CM | POA: Diagnosis not present

## 2016-01-28 DIAGNOSIS — Z96653 Presence of artificial knee joint, bilateral: Secondary | ICD-10-CM | POA: Diagnosis not present

## 2016-01-28 DIAGNOSIS — S72401A Unspecified fracture of lower end of right femur, initial encounter for closed fracture: Secondary | ICD-10-CM | POA: Diagnosis not present

## 2016-01-28 DIAGNOSIS — S199XXA Unspecified injury of neck, initial encounter: Secondary | ICD-10-CM | POA: Diagnosis not present

## 2016-01-28 DIAGNOSIS — S22029A Unspecified fracture of second thoracic vertebra, initial encounter for closed fracture: Secondary | ICD-10-CM | POA: Diagnosis not present

## 2016-01-28 DIAGNOSIS — Z6834 Body mass index (BMI) 34.0-34.9, adult: Secondary | ICD-10-CM | POA: Diagnosis not present

## 2016-01-28 DIAGNOSIS — S299XXA Unspecified injury of thorax, initial encounter: Secondary | ICD-10-CM | POA: Diagnosis not present

## 2016-01-28 DIAGNOSIS — M9701XA Periprosthetic fracture around internal prosthetic right hip joint, initial encounter: Secondary | ICD-10-CM | POA: Diagnosis present

## 2016-01-28 DIAGNOSIS — G4733 Obstructive sleep apnea (adult) (pediatric): Secondary | ICD-10-CM | POA: Diagnosis not present

## 2016-01-28 DIAGNOSIS — Z96649 Presence of unspecified artificial hip joint: Secondary | ICD-10-CM | POA: Diagnosis not present

## 2016-01-28 DIAGNOSIS — Z7901 Long term (current) use of anticoagulants: Secondary | ICD-10-CM | POA: Diagnosis not present

## 2016-01-31 DIAGNOSIS — D62 Acute posthemorrhagic anemia: Secondary | ICD-10-CM | POA: Insufficient documentation

## 2016-02-02 DIAGNOSIS — Z9989 Dependence on other enabling machines and devices: Secondary | ICD-10-CM | POA: Diagnosis not present

## 2016-02-02 DIAGNOSIS — T148 Other injury of unspecified body region: Secondary | ICD-10-CM | POA: Diagnosis not present

## 2016-02-02 DIAGNOSIS — G8918 Other acute postprocedural pain: Secondary | ICD-10-CM | POA: Diagnosis not present

## 2016-02-02 DIAGNOSIS — M109 Gout, unspecified: Secondary | ICD-10-CM | POA: Diagnosis not present

## 2016-02-02 DIAGNOSIS — D649 Anemia, unspecified: Secondary | ICD-10-CM | POA: Diagnosis not present

## 2016-02-02 DIAGNOSIS — Z6834 Body mass index (BMI) 34.0-34.9, adult: Secondary | ICD-10-CM | POA: Diagnosis not present

## 2016-02-02 DIAGNOSIS — Z96653 Presence of artificial knee joint, bilateral: Secondary | ICD-10-CM | POA: Diagnosis not present

## 2016-02-02 DIAGNOSIS — D62 Acute posthemorrhagic anemia: Secondary | ICD-10-CM | POA: Diagnosis not present

## 2016-02-02 DIAGNOSIS — E669 Obesity, unspecified: Secondary | ICD-10-CM | POA: Diagnosis not present

## 2016-02-02 DIAGNOSIS — M169 Osteoarthritis of hip, unspecified: Secondary | ICD-10-CM | POA: Diagnosis not present

## 2016-02-02 DIAGNOSIS — Z7409 Other reduced mobility: Secondary | ICD-10-CM | POA: Diagnosis not present

## 2016-02-02 DIAGNOSIS — M9701XA Periprosthetic fracture around internal prosthetic right hip joint, initial encounter: Secondary | ICD-10-CM | POA: Diagnosis not present

## 2016-02-02 DIAGNOSIS — E119 Type 2 diabetes mellitus without complications: Secondary | ICD-10-CM | POA: Diagnosis not present

## 2016-02-02 DIAGNOSIS — R140 Abdominal distension (gaseous): Secondary | ICD-10-CM | POA: Diagnosis not present

## 2016-02-02 DIAGNOSIS — E1165 Type 2 diabetes mellitus with hyperglycemia: Secondary | ICD-10-CM | POA: Diagnosis not present

## 2016-02-02 DIAGNOSIS — M9701XD Periprosthetic fracture around internal prosthetic right hip joint, subsequent encounter: Secondary | ICD-10-CM | POA: Diagnosis not present

## 2016-02-02 DIAGNOSIS — I10 Essential (primary) hypertension: Secondary | ICD-10-CM | POA: Diagnosis not present

## 2016-02-02 DIAGNOSIS — M978XXA Periprosthetic fracture around other internal prosthetic joint, initial encounter: Secondary | ICD-10-CM | POA: Diagnosis not present

## 2016-02-02 DIAGNOSIS — S7291XA Unspecified fracture of right femur, initial encounter for closed fracture: Secondary | ICD-10-CM | POA: Diagnosis not present

## 2016-02-02 DIAGNOSIS — G4733 Obstructive sleep apnea (adult) (pediatric): Secondary | ICD-10-CM | POA: Diagnosis not present

## 2016-02-02 DIAGNOSIS — S72351A Displaced comminuted fracture of shaft of right femur, initial encounter for closed fracture: Secondary | ICD-10-CM | POA: Diagnosis not present

## 2016-02-02 DIAGNOSIS — Z96612 Presence of left artificial shoulder joint: Secondary | ICD-10-CM | POA: Diagnosis not present

## 2016-02-02 DIAGNOSIS — Z96649 Presence of unspecified artificial hip joint: Secondary | ICD-10-CM | POA: Diagnosis not present

## 2016-02-02 DIAGNOSIS — Z96641 Presence of right artificial hip joint: Secondary | ICD-10-CM | POA: Diagnosis not present

## 2016-02-06 DIAGNOSIS — E1165 Type 2 diabetes mellitus with hyperglycemia: Secondary | ICD-10-CM | POA: Diagnosis not present

## 2016-02-06 DIAGNOSIS — G8918 Other acute postprocedural pain: Secondary | ICD-10-CM | POA: Diagnosis not present

## 2016-02-06 DIAGNOSIS — D649 Anemia, unspecified: Secondary | ICD-10-CM | POA: Diagnosis not present

## 2016-02-06 DIAGNOSIS — M9701XD Periprosthetic fracture around internal prosthetic right hip joint, subsequent encounter: Secondary | ICD-10-CM | POA: Diagnosis not present

## 2016-02-15 DIAGNOSIS — S7291XA Unspecified fracture of right femur, initial encounter for closed fracture: Secondary | ICD-10-CM | POA: Diagnosis not present

## 2016-02-24 DIAGNOSIS — E119 Type 2 diabetes mellitus without complications: Secondary | ICD-10-CM | POA: Diagnosis not present

## 2016-02-24 DIAGNOSIS — F339 Major depressive disorder, recurrent, unspecified: Secondary | ICD-10-CM | POA: Diagnosis not present

## 2016-02-24 DIAGNOSIS — Z87891 Personal history of nicotine dependence: Secondary | ICD-10-CM | POA: Diagnosis not present

## 2016-02-24 DIAGNOSIS — D62 Acute posthemorrhagic anemia: Secondary | ICD-10-CM | POA: Diagnosis not present

## 2016-02-24 DIAGNOSIS — M9701XD Periprosthetic fracture around internal prosthetic right hip joint, subsequent encounter: Secondary | ICD-10-CM | POA: Diagnosis not present

## 2016-02-24 DIAGNOSIS — M109 Gout, unspecified: Secondary | ICD-10-CM | POA: Diagnosis not present

## 2016-02-24 DIAGNOSIS — G4733 Obstructive sleep apnea (adult) (pediatric): Secondary | ICD-10-CM | POA: Diagnosis not present

## 2016-02-24 DIAGNOSIS — M169 Osteoarthritis of hip, unspecified: Secondary | ICD-10-CM | POA: Diagnosis not present

## 2016-02-24 DIAGNOSIS — I10 Essential (primary) hypertension: Secondary | ICD-10-CM | POA: Diagnosis not present

## 2016-02-29 DIAGNOSIS — G4733 Obstructive sleep apnea (adult) (pediatric): Secondary | ICD-10-CM | POA: Diagnosis not present

## 2016-02-29 DIAGNOSIS — M9701XD Periprosthetic fracture around internal prosthetic right hip joint, subsequent encounter: Secondary | ICD-10-CM | POA: Diagnosis not present

## 2016-02-29 DIAGNOSIS — E119 Type 2 diabetes mellitus without complications: Secondary | ICD-10-CM | POA: Diagnosis not present

## 2016-02-29 DIAGNOSIS — D62 Acute posthemorrhagic anemia: Secondary | ICD-10-CM | POA: Diagnosis not present

## 2016-02-29 DIAGNOSIS — I10 Essential (primary) hypertension: Secondary | ICD-10-CM | POA: Diagnosis not present

## 2016-02-29 DIAGNOSIS — M169 Osteoarthritis of hip, unspecified: Secondary | ICD-10-CM | POA: Diagnosis not present

## 2016-03-07 DIAGNOSIS — S7291XA Unspecified fracture of right femur, initial encounter for closed fracture: Secondary | ICD-10-CM | POA: Diagnosis not present

## 2016-03-08 DIAGNOSIS — I10 Essential (primary) hypertension: Secondary | ICD-10-CM | POA: Diagnosis not present

## 2016-03-08 DIAGNOSIS — M169 Osteoarthritis of hip, unspecified: Secondary | ICD-10-CM | POA: Diagnosis not present

## 2016-03-08 DIAGNOSIS — G4733 Obstructive sleep apnea (adult) (pediatric): Secondary | ICD-10-CM | POA: Diagnosis not present

## 2016-03-08 DIAGNOSIS — E119 Type 2 diabetes mellitus without complications: Secondary | ICD-10-CM | POA: Diagnosis not present

## 2016-03-08 DIAGNOSIS — D62 Acute posthemorrhagic anemia: Secondary | ICD-10-CM | POA: Diagnosis not present

## 2016-03-08 DIAGNOSIS — M9701XD Periprosthetic fracture around internal prosthetic right hip joint, subsequent encounter: Secondary | ICD-10-CM | POA: Diagnosis not present

## 2016-03-09 DIAGNOSIS — I10 Essential (primary) hypertension: Secondary | ICD-10-CM | POA: Diagnosis not present

## 2016-03-09 DIAGNOSIS — E669 Obesity, unspecified: Secondary | ICD-10-CM | POA: Diagnosis not present

## 2016-03-09 DIAGNOSIS — E7800 Pure hypercholesterolemia, unspecified: Secondary | ICD-10-CM | POA: Diagnosis not present

## 2016-03-09 DIAGNOSIS — Z8781 Personal history of (healed) traumatic fracture: Secondary | ICD-10-CM | POA: Diagnosis not present

## 2016-04-18 DIAGNOSIS — S7291XA Unspecified fracture of right femur, initial encounter for closed fracture: Secondary | ICD-10-CM | POA: Diagnosis not present

## 2016-04-19 DIAGNOSIS — R600 Localized edema: Secondary | ICD-10-CM | POA: Diagnosis not present

## 2016-04-20 DIAGNOSIS — Z9181 History of falling: Secondary | ICD-10-CM | POA: Diagnosis not present

## 2016-04-20 DIAGNOSIS — Z87891 Personal history of nicotine dependence: Secondary | ICD-10-CM | POA: Diagnosis not present

## 2016-04-20 DIAGNOSIS — Z7982 Long term (current) use of aspirin: Secondary | ICD-10-CM | POA: Diagnosis not present

## 2016-04-20 DIAGNOSIS — E119 Type 2 diabetes mellitus without complications: Secondary | ICD-10-CM | POA: Diagnosis not present

## 2016-04-20 DIAGNOSIS — I10 Essential (primary) hypertension: Secondary | ICD-10-CM | POA: Diagnosis not present

## 2016-04-20 DIAGNOSIS — M9701XD Periprosthetic fracture around internal prosthetic right hip joint, subsequent encounter: Secondary | ICD-10-CM | POA: Diagnosis not present

## 2016-04-20 DIAGNOSIS — Z7984 Long term (current) use of oral hypoglycemic drugs: Secondary | ICD-10-CM | POA: Diagnosis not present

## 2016-04-20 DIAGNOSIS — L03116 Cellulitis of left lower limb: Secondary | ICD-10-CM | POA: Diagnosis not present

## 2016-04-23 DIAGNOSIS — M9701XD Periprosthetic fracture around internal prosthetic right hip joint, subsequent encounter: Secondary | ICD-10-CM | POA: Diagnosis not present

## 2016-04-23 DIAGNOSIS — I10 Essential (primary) hypertension: Secondary | ICD-10-CM | POA: Diagnosis not present

## 2016-04-23 DIAGNOSIS — E119 Type 2 diabetes mellitus without complications: Secondary | ICD-10-CM | POA: Diagnosis not present

## 2016-04-23 DIAGNOSIS — L03116 Cellulitis of left lower limb: Secondary | ICD-10-CM | POA: Diagnosis not present

## 2016-04-23 DIAGNOSIS — Z7984 Long term (current) use of oral hypoglycemic drugs: Secondary | ICD-10-CM | POA: Diagnosis not present

## 2016-04-23 DIAGNOSIS — Z7982 Long term (current) use of aspirin: Secondary | ICD-10-CM | POA: Diagnosis not present

## 2016-04-24 DIAGNOSIS — M9701XD Periprosthetic fracture around internal prosthetic right hip joint, subsequent encounter: Secondary | ICD-10-CM | POA: Diagnosis not present

## 2016-04-24 DIAGNOSIS — Z7984 Long term (current) use of oral hypoglycemic drugs: Secondary | ICD-10-CM | POA: Diagnosis not present

## 2016-04-24 DIAGNOSIS — Z7982 Long term (current) use of aspirin: Secondary | ICD-10-CM | POA: Diagnosis not present

## 2016-04-24 DIAGNOSIS — I10 Essential (primary) hypertension: Secondary | ICD-10-CM | POA: Diagnosis not present

## 2016-04-24 DIAGNOSIS — E119 Type 2 diabetes mellitus without complications: Secondary | ICD-10-CM | POA: Diagnosis not present

## 2016-04-24 DIAGNOSIS — L03116 Cellulitis of left lower limb: Secondary | ICD-10-CM | POA: Diagnosis not present

## 2016-04-26 DIAGNOSIS — M9701XD Periprosthetic fracture around internal prosthetic right hip joint, subsequent encounter: Secondary | ICD-10-CM | POA: Diagnosis not present

## 2016-04-26 DIAGNOSIS — L03116 Cellulitis of left lower limb: Secondary | ICD-10-CM | POA: Diagnosis not present

## 2016-04-26 DIAGNOSIS — I10 Essential (primary) hypertension: Secondary | ICD-10-CM | POA: Diagnosis not present

## 2016-04-26 DIAGNOSIS — Z7982 Long term (current) use of aspirin: Secondary | ICD-10-CM | POA: Diagnosis not present

## 2016-04-26 DIAGNOSIS — E119 Type 2 diabetes mellitus without complications: Secondary | ICD-10-CM | POA: Diagnosis not present

## 2016-04-26 DIAGNOSIS — Z7984 Long term (current) use of oral hypoglycemic drugs: Secondary | ICD-10-CM | POA: Diagnosis not present

## 2016-04-30 DIAGNOSIS — I10 Essential (primary) hypertension: Secondary | ICD-10-CM | POA: Diagnosis not present

## 2016-04-30 DIAGNOSIS — E119 Type 2 diabetes mellitus without complications: Secondary | ICD-10-CM | POA: Diagnosis not present

## 2016-04-30 DIAGNOSIS — Z7982 Long term (current) use of aspirin: Secondary | ICD-10-CM | POA: Diagnosis not present

## 2016-04-30 DIAGNOSIS — L03116 Cellulitis of left lower limb: Secondary | ICD-10-CM | POA: Diagnosis not present

## 2016-04-30 DIAGNOSIS — M9701XD Periprosthetic fracture around internal prosthetic right hip joint, subsequent encounter: Secondary | ICD-10-CM | POA: Diagnosis not present

## 2016-04-30 DIAGNOSIS — Z7984 Long term (current) use of oral hypoglycemic drugs: Secondary | ICD-10-CM | POA: Diagnosis not present

## 2016-05-01 DIAGNOSIS — Z7982 Long term (current) use of aspirin: Secondary | ICD-10-CM | POA: Diagnosis not present

## 2016-05-01 DIAGNOSIS — Z7984 Long term (current) use of oral hypoglycemic drugs: Secondary | ICD-10-CM | POA: Diagnosis not present

## 2016-05-01 DIAGNOSIS — L03116 Cellulitis of left lower limb: Secondary | ICD-10-CM | POA: Diagnosis not present

## 2016-05-01 DIAGNOSIS — I10 Essential (primary) hypertension: Secondary | ICD-10-CM | POA: Diagnosis not present

## 2016-05-01 DIAGNOSIS — E119 Type 2 diabetes mellitus without complications: Secondary | ICD-10-CM | POA: Diagnosis not present

## 2016-05-01 DIAGNOSIS — M9701XD Periprosthetic fracture around internal prosthetic right hip joint, subsequent encounter: Secondary | ICD-10-CM | POA: Diagnosis not present

## 2016-05-04 DIAGNOSIS — E119 Type 2 diabetes mellitus without complications: Secondary | ICD-10-CM | POA: Diagnosis not present

## 2016-05-04 DIAGNOSIS — Z7982 Long term (current) use of aspirin: Secondary | ICD-10-CM | POA: Diagnosis not present

## 2016-05-04 DIAGNOSIS — M9701XD Periprosthetic fracture around internal prosthetic right hip joint, subsequent encounter: Secondary | ICD-10-CM | POA: Diagnosis not present

## 2016-05-04 DIAGNOSIS — Z7984 Long term (current) use of oral hypoglycemic drugs: Secondary | ICD-10-CM | POA: Diagnosis not present

## 2016-05-04 DIAGNOSIS — L03116 Cellulitis of left lower limb: Secondary | ICD-10-CM | POA: Diagnosis not present

## 2016-05-04 DIAGNOSIS — I10 Essential (primary) hypertension: Secondary | ICD-10-CM | POA: Diagnosis not present

## 2016-05-08 DIAGNOSIS — E119 Type 2 diabetes mellitus without complications: Secondary | ICD-10-CM | POA: Diagnosis not present

## 2016-05-08 DIAGNOSIS — L03116 Cellulitis of left lower limb: Secondary | ICD-10-CM | POA: Diagnosis not present

## 2016-05-08 DIAGNOSIS — M9701XD Periprosthetic fracture around internal prosthetic right hip joint, subsequent encounter: Secondary | ICD-10-CM | POA: Diagnosis not present

## 2016-05-08 DIAGNOSIS — Z7984 Long term (current) use of oral hypoglycemic drugs: Secondary | ICD-10-CM | POA: Diagnosis not present

## 2016-05-08 DIAGNOSIS — I10 Essential (primary) hypertension: Secondary | ICD-10-CM | POA: Diagnosis not present

## 2016-05-08 DIAGNOSIS — Z7982 Long term (current) use of aspirin: Secondary | ICD-10-CM | POA: Diagnosis not present

## 2016-05-10 DIAGNOSIS — L03116 Cellulitis of left lower limb: Secondary | ICD-10-CM | POA: Diagnosis not present

## 2016-05-10 DIAGNOSIS — M9701XD Periprosthetic fracture around internal prosthetic right hip joint, subsequent encounter: Secondary | ICD-10-CM | POA: Diagnosis not present

## 2016-05-10 DIAGNOSIS — E119 Type 2 diabetes mellitus without complications: Secondary | ICD-10-CM | POA: Diagnosis not present

## 2016-05-10 DIAGNOSIS — Z7984 Long term (current) use of oral hypoglycemic drugs: Secondary | ICD-10-CM | POA: Diagnosis not present

## 2016-05-10 DIAGNOSIS — I10 Essential (primary) hypertension: Secondary | ICD-10-CM | POA: Diagnosis not present

## 2016-05-10 DIAGNOSIS — Z7982 Long term (current) use of aspirin: Secondary | ICD-10-CM | POA: Diagnosis not present

## 2016-05-16 DIAGNOSIS — N401 Enlarged prostate with lower urinary tract symptoms: Secondary | ICD-10-CM | POA: Diagnosis not present

## 2016-05-16 DIAGNOSIS — R351 Nocturia: Secondary | ICD-10-CM | POA: Diagnosis not present

## 2016-05-17 DIAGNOSIS — M6281 Muscle weakness (generalized): Secondary | ICD-10-CM | POA: Diagnosis not present

## 2016-05-17 DIAGNOSIS — R2689 Other abnormalities of gait and mobility: Secondary | ICD-10-CM | POA: Diagnosis not present

## 2016-05-17 DIAGNOSIS — S7291XS Unspecified fracture of right femur, sequela: Secondary | ICD-10-CM | POA: Diagnosis not present

## 2016-05-17 DIAGNOSIS — M25561 Pain in right knee: Secondary | ICD-10-CM | POA: Diagnosis not present

## 2016-05-23 DIAGNOSIS — M25561 Pain in right knee: Secondary | ICD-10-CM | POA: Diagnosis not present

## 2016-05-23 DIAGNOSIS — S7291XA Unspecified fracture of right femur, initial encounter for closed fracture: Secondary | ICD-10-CM | POA: Diagnosis not present

## 2016-05-24 DIAGNOSIS — M25561 Pain in right knee: Secondary | ICD-10-CM | POA: Diagnosis not present

## 2016-05-24 DIAGNOSIS — R2689 Other abnormalities of gait and mobility: Secondary | ICD-10-CM | POA: Diagnosis not present

## 2016-05-24 DIAGNOSIS — S7291XS Unspecified fracture of right femur, sequela: Secondary | ICD-10-CM | POA: Diagnosis not present

## 2016-05-24 DIAGNOSIS — M6281 Muscle weakness (generalized): Secondary | ICD-10-CM | POA: Diagnosis not present

## 2016-05-29 DIAGNOSIS — R600 Localized edema: Secondary | ICD-10-CM | POA: Diagnosis not present

## 2016-05-29 DIAGNOSIS — M25561 Pain in right knee: Secondary | ICD-10-CM | POA: Diagnosis not present

## 2016-05-30 DIAGNOSIS — S7291XS Unspecified fracture of right femur, sequela: Secondary | ICD-10-CM | POA: Diagnosis not present

## 2016-05-30 DIAGNOSIS — M25561 Pain in right knee: Secondary | ICD-10-CM | POA: Diagnosis not present

## 2016-05-30 DIAGNOSIS — M6281 Muscle weakness (generalized): Secondary | ICD-10-CM | POA: Diagnosis not present

## 2016-05-30 DIAGNOSIS — R2689 Other abnormalities of gait and mobility: Secondary | ICD-10-CM | POA: Diagnosis not present

## 2016-06-01 DIAGNOSIS — M6281 Muscle weakness (generalized): Secondary | ICD-10-CM | POA: Diagnosis not present

## 2016-06-01 DIAGNOSIS — S7291XS Unspecified fracture of right femur, sequela: Secondary | ICD-10-CM | POA: Diagnosis not present

## 2016-06-01 DIAGNOSIS — M25561 Pain in right knee: Secondary | ICD-10-CM | POA: Diagnosis not present

## 2016-06-01 DIAGNOSIS — R2689 Other abnormalities of gait and mobility: Secondary | ICD-10-CM | POA: Diagnosis not present

## 2016-06-05 DIAGNOSIS — M25561 Pain in right knee: Secondary | ICD-10-CM | POA: Diagnosis not present

## 2016-06-05 DIAGNOSIS — S7291XS Unspecified fracture of right femur, sequela: Secondary | ICD-10-CM | POA: Diagnosis not present

## 2016-06-05 DIAGNOSIS — M6281 Muscle weakness (generalized): Secondary | ICD-10-CM | POA: Diagnosis not present

## 2016-06-05 DIAGNOSIS — R2689 Other abnormalities of gait and mobility: Secondary | ICD-10-CM | POA: Diagnosis not present

## 2016-06-07 DIAGNOSIS — S7291XS Unspecified fracture of right femur, sequela: Secondary | ICD-10-CM | POA: Diagnosis not present

## 2016-06-07 DIAGNOSIS — M6281 Muscle weakness (generalized): Secondary | ICD-10-CM | POA: Diagnosis not present

## 2016-06-07 DIAGNOSIS — R2689 Other abnormalities of gait and mobility: Secondary | ICD-10-CM | POA: Diagnosis not present

## 2016-06-07 DIAGNOSIS — M25561 Pain in right knee: Secondary | ICD-10-CM | POA: Diagnosis not present

## 2016-06-12 DIAGNOSIS — M25561 Pain in right knee: Secondary | ICD-10-CM | POA: Diagnosis not present

## 2016-06-12 DIAGNOSIS — M6281 Muscle weakness (generalized): Secondary | ICD-10-CM | POA: Diagnosis not present

## 2016-06-12 DIAGNOSIS — S7291XS Unspecified fracture of right femur, sequela: Secondary | ICD-10-CM | POA: Diagnosis not present

## 2016-06-12 DIAGNOSIS — R2689 Other abnormalities of gait and mobility: Secondary | ICD-10-CM | POA: Diagnosis not present

## 2016-06-14 DIAGNOSIS — M25561 Pain in right knee: Secondary | ICD-10-CM | POA: Diagnosis not present

## 2016-06-14 DIAGNOSIS — R2689 Other abnormalities of gait and mobility: Secondary | ICD-10-CM | POA: Diagnosis not present

## 2016-06-14 DIAGNOSIS — M6281 Muscle weakness (generalized): Secondary | ICD-10-CM | POA: Diagnosis not present

## 2016-06-14 DIAGNOSIS — S7291XS Unspecified fracture of right femur, sequela: Secondary | ICD-10-CM | POA: Diagnosis not present

## 2016-06-19 DIAGNOSIS — M6281 Muscle weakness (generalized): Secondary | ICD-10-CM | POA: Diagnosis not present

## 2016-06-19 DIAGNOSIS — S7291XS Unspecified fracture of right femur, sequela: Secondary | ICD-10-CM | POA: Diagnosis not present

## 2016-06-19 DIAGNOSIS — M25561 Pain in right knee: Secondary | ICD-10-CM | POA: Diagnosis not present

## 2016-06-19 DIAGNOSIS — R2689 Other abnormalities of gait and mobility: Secondary | ICD-10-CM | POA: Diagnosis not present

## 2016-06-21 DIAGNOSIS — S7291XS Unspecified fracture of right femur, sequela: Secondary | ICD-10-CM | POA: Diagnosis not present

## 2016-06-21 DIAGNOSIS — M6281 Muscle weakness (generalized): Secondary | ICD-10-CM | POA: Diagnosis not present

## 2016-06-21 DIAGNOSIS — M25561 Pain in right knee: Secondary | ICD-10-CM | POA: Diagnosis not present

## 2016-06-21 DIAGNOSIS — R2689 Other abnormalities of gait and mobility: Secondary | ICD-10-CM | POA: Diagnosis not present

## 2016-06-26 DIAGNOSIS — M6281 Muscle weakness (generalized): Secondary | ICD-10-CM | POA: Diagnosis not present

## 2016-06-26 DIAGNOSIS — S7291XS Unspecified fracture of right femur, sequela: Secondary | ICD-10-CM | POA: Diagnosis not present

## 2016-06-26 DIAGNOSIS — R2689 Other abnormalities of gait and mobility: Secondary | ICD-10-CM | POA: Diagnosis not present

## 2016-06-26 DIAGNOSIS — M25561 Pain in right knee: Secondary | ICD-10-CM | POA: Diagnosis not present

## 2016-06-28 DIAGNOSIS — R2689 Other abnormalities of gait and mobility: Secondary | ICD-10-CM | POA: Diagnosis not present

## 2016-06-28 DIAGNOSIS — S7291XS Unspecified fracture of right femur, sequela: Secondary | ICD-10-CM | POA: Diagnosis not present

## 2016-06-28 DIAGNOSIS — M25561 Pain in right knee: Secondary | ICD-10-CM | POA: Diagnosis not present

## 2016-06-28 DIAGNOSIS — M6281 Muscle weakness (generalized): Secondary | ICD-10-CM | POA: Diagnosis not present

## 2016-07-03 DIAGNOSIS — M6281 Muscle weakness (generalized): Secondary | ICD-10-CM | POA: Diagnosis not present

## 2016-07-03 DIAGNOSIS — S7291XS Unspecified fracture of right femur, sequela: Secondary | ICD-10-CM | POA: Diagnosis not present

## 2016-07-03 DIAGNOSIS — M25561 Pain in right knee: Secondary | ICD-10-CM | POA: Diagnosis not present

## 2016-07-03 DIAGNOSIS — R2689 Other abnormalities of gait and mobility: Secondary | ICD-10-CM | POA: Diagnosis not present

## 2016-07-05 DIAGNOSIS — M6281 Muscle weakness (generalized): Secondary | ICD-10-CM | POA: Diagnosis not present

## 2016-07-05 DIAGNOSIS — R2689 Other abnormalities of gait and mobility: Secondary | ICD-10-CM | POA: Diagnosis not present

## 2016-07-05 DIAGNOSIS — M25561 Pain in right knee: Secondary | ICD-10-CM | POA: Diagnosis not present

## 2016-07-05 DIAGNOSIS — S7291XA Unspecified fracture of right femur, initial encounter for closed fracture: Secondary | ICD-10-CM | POA: Diagnosis not present

## 2016-07-05 DIAGNOSIS — S7291XS Unspecified fracture of right femur, sequela: Secondary | ICD-10-CM | POA: Diagnosis not present

## 2016-07-10 DIAGNOSIS — R2689 Other abnormalities of gait and mobility: Secondary | ICD-10-CM | POA: Diagnosis not present

## 2016-07-10 DIAGNOSIS — M6281 Muscle weakness (generalized): Secondary | ICD-10-CM | POA: Diagnosis not present

## 2016-07-10 DIAGNOSIS — S7291XS Unspecified fracture of right femur, sequela: Secondary | ICD-10-CM | POA: Diagnosis not present

## 2016-07-10 DIAGNOSIS — M25561 Pain in right knee: Secondary | ICD-10-CM | POA: Diagnosis not present

## 2016-07-12 DIAGNOSIS — R2689 Other abnormalities of gait and mobility: Secondary | ICD-10-CM | POA: Diagnosis not present

## 2016-07-12 DIAGNOSIS — S7291XS Unspecified fracture of right femur, sequela: Secondary | ICD-10-CM | POA: Diagnosis not present

## 2016-07-12 DIAGNOSIS — M6281 Muscle weakness (generalized): Secondary | ICD-10-CM | POA: Diagnosis not present

## 2016-07-12 DIAGNOSIS — M25561 Pain in right knee: Secondary | ICD-10-CM | POA: Diagnosis not present

## 2016-07-17 DIAGNOSIS — M6281 Muscle weakness (generalized): Secondary | ICD-10-CM | POA: Diagnosis not present

## 2016-07-17 DIAGNOSIS — R2689 Other abnormalities of gait and mobility: Secondary | ICD-10-CM | POA: Diagnosis not present

## 2016-07-17 DIAGNOSIS — S7291XS Unspecified fracture of right femur, sequela: Secondary | ICD-10-CM | POA: Diagnosis not present

## 2016-07-17 DIAGNOSIS — M25561 Pain in right knee: Secondary | ICD-10-CM | POA: Diagnosis not present

## 2016-07-19 DIAGNOSIS — M25561 Pain in right knee: Secondary | ICD-10-CM | POA: Diagnosis not present

## 2016-07-19 DIAGNOSIS — R2689 Other abnormalities of gait and mobility: Secondary | ICD-10-CM | POA: Diagnosis not present

## 2016-07-19 DIAGNOSIS — M6281 Muscle weakness (generalized): Secondary | ICD-10-CM | POA: Diagnosis not present

## 2016-07-19 DIAGNOSIS — S7291XS Unspecified fracture of right femur, sequela: Secondary | ICD-10-CM | POA: Diagnosis not present

## 2016-07-24 DIAGNOSIS — R2689 Other abnormalities of gait and mobility: Secondary | ICD-10-CM | POA: Diagnosis not present

## 2016-07-24 DIAGNOSIS — M25561 Pain in right knee: Secondary | ICD-10-CM | POA: Diagnosis not present

## 2016-07-24 DIAGNOSIS — S7291XS Unspecified fracture of right femur, sequela: Secondary | ICD-10-CM | POA: Diagnosis not present

## 2016-07-24 DIAGNOSIS — M6281 Muscle weakness (generalized): Secondary | ICD-10-CM | POA: Diagnosis not present

## 2016-07-26 DIAGNOSIS — R2689 Other abnormalities of gait and mobility: Secondary | ICD-10-CM | POA: Diagnosis not present

## 2016-07-26 DIAGNOSIS — M6281 Muscle weakness (generalized): Secondary | ICD-10-CM | POA: Diagnosis not present

## 2016-07-26 DIAGNOSIS — S7291XS Unspecified fracture of right femur, sequela: Secondary | ICD-10-CM | POA: Diagnosis not present

## 2016-07-26 DIAGNOSIS — M25561 Pain in right knee: Secondary | ICD-10-CM | POA: Diagnosis not present

## 2016-07-31 DIAGNOSIS — M25561 Pain in right knee: Secondary | ICD-10-CM | POA: Diagnosis not present

## 2016-07-31 DIAGNOSIS — M6281 Muscle weakness (generalized): Secondary | ICD-10-CM | POA: Diagnosis not present

## 2016-07-31 DIAGNOSIS — S7291XS Unspecified fracture of right femur, sequela: Secondary | ICD-10-CM | POA: Diagnosis not present

## 2016-07-31 DIAGNOSIS — R2689 Other abnormalities of gait and mobility: Secondary | ICD-10-CM | POA: Diagnosis not present

## 2016-08-02 DIAGNOSIS — M25561 Pain in right knee: Secondary | ICD-10-CM | POA: Diagnosis not present

## 2016-08-02 DIAGNOSIS — S7291XS Unspecified fracture of right femur, sequela: Secondary | ICD-10-CM | POA: Diagnosis not present

## 2016-08-02 DIAGNOSIS — R2689 Other abnormalities of gait and mobility: Secondary | ICD-10-CM | POA: Diagnosis not present

## 2016-08-02 DIAGNOSIS — M6281 Muscle weakness (generalized): Secondary | ICD-10-CM | POA: Diagnosis not present

## 2016-08-07 DIAGNOSIS — M6281 Muscle weakness (generalized): Secondary | ICD-10-CM | POA: Diagnosis not present

## 2016-08-07 DIAGNOSIS — S7291XS Unspecified fracture of right femur, sequela: Secondary | ICD-10-CM | POA: Diagnosis not present

## 2016-08-07 DIAGNOSIS — R2689 Other abnormalities of gait and mobility: Secondary | ICD-10-CM | POA: Diagnosis not present

## 2016-08-07 DIAGNOSIS — M25561 Pain in right knee: Secondary | ICD-10-CM | POA: Diagnosis not present

## 2016-08-08 DIAGNOSIS — Z23 Encounter for immunization: Secondary | ICD-10-CM | POA: Diagnosis not present

## 2016-08-09 DIAGNOSIS — R2689 Other abnormalities of gait and mobility: Secondary | ICD-10-CM | POA: Diagnosis not present

## 2016-08-09 DIAGNOSIS — S7291XS Unspecified fracture of right femur, sequela: Secondary | ICD-10-CM | POA: Diagnosis not present

## 2016-08-09 DIAGNOSIS — M25561 Pain in right knee: Secondary | ICD-10-CM | POA: Diagnosis not present

## 2016-08-09 DIAGNOSIS — M6281 Muscle weakness (generalized): Secondary | ICD-10-CM | POA: Diagnosis not present

## 2016-08-14 DIAGNOSIS — R2689 Other abnormalities of gait and mobility: Secondary | ICD-10-CM | POA: Diagnosis not present

## 2016-08-14 DIAGNOSIS — M25561 Pain in right knee: Secondary | ICD-10-CM | POA: Diagnosis not present

## 2016-08-14 DIAGNOSIS — S7291XS Unspecified fracture of right femur, sequela: Secondary | ICD-10-CM | POA: Diagnosis not present

## 2016-08-14 DIAGNOSIS — M6281 Muscle weakness (generalized): Secondary | ICD-10-CM | POA: Diagnosis not present

## 2016-10-24 DIAGNOSIS — I10 Essential (primary) hypertension: Secondary | ICD-10-CM | POA: Diagnosis not present

## 2016-10-24 DIAGNOSIS — M109 Gout, unspecified: Secondary | ICD-10-CM | POA: Diagnosis not present

## 2016-10-24 DIAGNOSIS — E119 Type 2 diabetes mellitus without complications: Secondary | ICD-10-CM | POA: Diagnosis not present

## 2016-10-24 DIAGNOSIS — E785 Hyperlipidemia, unspecified: Secondary | ICD-10-CM | POA: Diagnosis not present

## 2016-10-24 DIAGNOSIS — Z79899 Other long term (current) drug therapy: Secondary | ICD-10-CM | POA: Diagnosis not present

## 2016-11-16 DIAGNOSIS — Z125 Encounter for screening for malignant neoplasm of prostate: Secondary | ICD-10-CM | POA: Diagnosis not present

## 2016-11-16 DIAGNOSIS — N401 Enlarged prostate with lower urinary tract symptoms: Secondary | ICD-10-CM | POA: Diagnosis not present

## 2017-06-27 DIAGNOSIS — N401 Enlarged prostate with lower urinary tract symptoms: Secondary | ICD-10-CM | POA: Diagnosis not present

## 2017-06-27 DIAGNOSIS — R351 Nocturia: Secondary | ICD-10-CM | POA: Diagnosis not present

## 2017-07-31 DIAGNOSIS — Z23 Encounter for immunization: Secondary | ICD-10-CM | POA: Diagnosis not present

## 2017-09-03 DIAGNOSIS — L821 Other seborrheic keratosis: Secondary | ICD-10-CM | POA: Diagnosis not present

## 2017-09-03 DIAGNOSIS — L570 Actinic keratosis: Secondary | ICD-10-CM | POA: Diagnosis not present

## 2017-09-03 DIAGNOSIS — L578 Other skin changes due to chronic exposure to nonionizing radiation: Secondary | ICD-10-CM | POA: Diagnosis not present

## 2017-09-03 DIAGNOSIS — L814 Other melanin hyperpigmentation: Secondary | ICD-10-CM | POA: Diagnosis not present

## 2017-10-11 DIAGNOSIS — R05 Cough: Secondary | ICD-10-CM | POA: Diagnosis not present

## 2017-10-11 DIAGNOSIS — R0600 Dyspnea, unspecified: Secondary | ICD-10-CM | POA: Diagnosis not present

## 2017-10-11 DIAGNOSIS — R0602 Shortness of breath: Secondary | ICD-10-CM | POA: Diagnosis not present

## 2017-10-28 DIAGNOSIS — M9904 Segmental and somatic dysfunction of sacral region: Secondary | ICD-10-CM | POA: Diagnosis not present

## 2017-10-28 DIAGNOSIS — M9901 Segmental and somatic dysfunction of cervical region: Secondary | ICD-10-CM | POA: Diagnosis not present

## 2017-10-28 DIAGNOSIS — M9903 Segmental and somatic dysfunction of lumbar region: Secondary | ICD-10-CM | POA: Diagnosis not present

## 2017-10-28 DIAGNOSIS — M4606 Spinal enthesopathy, lumbar region: Secondary | ICD-10-CM | POA: Diagnosis not present

## 2017-10-28 DIAGNOSIS — M542 Cervicalgia: Secondary | ICD-10-CM | POA: Diagnosis not present

## 2017-10-28 DIAGNOSIS — M5387 Other specified dorsopathies, lumbosacral region: Secondary | ICD-10-CM | POA: Diagnosis not present

## 2017-10-28 DIAGNOSIS — M9902 Segmental and somatic dysfunction of thoracic region: Secondary | ICD-10-CM | POA: Diagnosis not present

## 2017-10-28 DIAGNOSIS — M545 Low back pain: Secondary | ICD-10-CM | POA: Diagnosis not present

## 2017-10-28 DIAGNOSIS — M50321 Other cervical disc degeneration at C4-C5 level: Secondary | ICD-10-CM | POA: Diagnosis not present

## 2017-10-29 DIAGNOSIS — M545 Low back pain: Secondary | ICD-10-CM | POA: Diagnosis not present

## 2017-10-29 DIAGNOSIS — M50321 Other cervical disc degeneration at C4-C5 level: Secondary | ICD-10-CM | POA: Diagnosis not present

## 2017-10-29 DIAGNOSIS — M9903 Segmental and somatic dysfunction of lumbar region: Secondary | ICD-10-CM | POA: Diagnosis not present

## 2017-10-29 DIAGNOSIS — M9902 Segmental and somatic dysfunction of thoracic region: Secondary | ICD-10-CM | POA: Diagnosis not present

## 2017-10-29 DIAGNOSIS — M542 Cervicalgia: Secondary | ICD-10-CM | POA: Diagnosis not present

## 2017-10-29 DIAGNOSIS — M5387 Other specified dorsopathies, lumbosacral region: Secondary | ICD-10-CM | POA: Diagnosis not present

## 2017-10-29 DIAGNOSIS — M9901 Segmental and somatic dysfunction of cervical region: Secondary | ICD-10-CM | POA: Diagnosis not present

## 2017-10-29 DIAGNOSIS — M9904 Segmental and somatic dysfunction of sacral region: Secondary | ICD-10-CM | POA: Diagnosis not present

## 2017-10-29 DIAGNOSIS — M4606 Spinal enthesopathy, lumbar region: Secondary | ICD-10-CM | POA: Diagnosis not present

## 2017-10-30 DIAGNOSIS — M109 Gout, unspecified: Secondary | ICD-10-CM | POA: Diagnosis not present

## 2017-10-30 DIAGNOSIS — M5387 Other specified dorsopathies, lumbosacral region: Secondary | ICD-10-CM | POA: Diagnosis not present

## 2017-10-30 DIAGNOSIS — E785 Hyperlipidemia, unspecified: Secondary | ICD-10-CM | POA: Diagnosis not present

## 2017-10-30 DIAGNOSIS — E119 Type 2 diabetes mellitus without complications: Secondary | ICD-10-CM | POA: Diagnosis not present

## 2017-10-30 DIAGNOSIS — M4606 Spinal enthesopathy, lumbar region: Secondary | ICD-10-CM | POA: Diagnosis not present

## 2017-10-30 DIAGNOSIS — M542 Cervicalgia: Secondary | ICD-10-CM | POA: Diagnosis not present

## 2017-10-30 DIAGNOSIS — M9902 Segmental and somatic dysfunction of thoracic region: Secondary | ICD-10-CM | POA: Diagnosis not present

## 2017-10-30 DIAGNOSIS — I10 Essential (primary) hypertension: Secondary | ICD-10-CM | POA: Diagnosis not present

## 2017-10-30 DIAGNOSIS — Z79899 Other long term (current) drug therapy: Secondary | ICD-10-CM | POA: Diagnosis not present

## 2017-10-30 DIAGNOSIS — Z0000 Encounter for general adult medical examination without abnormal findings: Secondary | ICD-10-CM | POA: Diagnosis not present

## 2017-10-30 DIAGNOSIS — M50321 Other cervical disc degeneration at C4-C5 level: Secondary | ICD-10-CM | POA: Diagnosis not present

## 2017-10-30 DIAGNOSIS — M9901 Segmental and somatic dysfunction of cervical region: Secondary | ICD-10-CM | POA: Diagnosis not present

## 2017-10-30 DIAGNOSIS — M545 Low back pain: Secondary | ICD-10-CM | POA: Diagnosis not present

## 2017-10-30 DIAGNOSIS — M9904 Segmental and somatic dysfunction of sacral region: Secondary | ICD-10-CM | POA: Diagnosis not present

## 2017-10-30 DIAGNOSIS — M9903 Segmental and somatic dysfunction of lumbar region: Secondary | ICD-10-CM | POA: Diagnosis not present

## 2017-10-31 DIAGNOSIS — M4606 Spinal enthesopathy, lumbar region: Secondary | ICD-10-CM | POA: Diagnosis not present

## 2017-10-31 DIAGNOSIS — M9901 Segmental and somatic dysfunction of cervical region: Secondary | ICD-10-CM | POA: Diagnosis not present

## 2017-10-31 DIAGNOSIS — M542 Cervicalgia: Secondary | ICD-10-CM | POA: Diagnosis not present

## 2017-10-31 DIAGNOSIS — M9903 Segmental and somatic dysfunction of lumbar region: Secondary | ICD-10-CM | POA: Diagnosis not present

## 2017-10-31 DIAGNOSIS — M5387 Other specified dorsopathies, lumbosacral region: Secondary | ICD-10-CM | POA: Diagnosis not present

## 2017-10-31 DIAGNOSIS — M50321 Other cervical disc degeneration at C4-C5 level: Secondary | ICD-10-CM | POA: Diagnosis not present

## 2017-10-31 DIAGNOSIS — M9904 Segmental and somatic dysfunction of sacral region: Secondary | ICD-10-CM | POA: Diagnosis not present

## 2017-10-31 DIAGNOSIS — M545 Low back pain: Secondary | ICD-10-CM | POA: Diagnosis not present

## 2017-10-31 DIAGNOSIS — M9902 Segmental and somatic dysfunction of thoracic region: Secondary | ICD-10-CM | POA: Diagnosis not present

## 2017-11-04 DIAGNOSIS — M4606 Spinal enthesopathy, lumbar region: Secondary | ICD-10-CM | POA: Diagnosis not present

## 2017-11-04 DIAGNOSIS — M9904 Segmental and somatic dysfunction of sacral region: Secondary | ICD-10-CM | POA: Diagnosis not present

## 2017-11-04 DIAGNOSIS — M9903 Segmental and somatic dysfunction of lumbar region: Secondary | ICD-10-CM | POA: Diagnosis not present

## 2017-11-04 DIAGNOSIS — M9902 Segmental and somatic dysfunction of thoracic region: Secondary | ICD-10-CM | POA: Diagnosis not present

## 2017-11-04 DIAGNOSIS — M545 Low back pain: Secondary | ICD-10-CM | POA: Diagnosis not present

## 2017-11-04 DIAGNOSIS — M542 Cervicalgia: Secondary | ICD-10-CM | POA: Diagnosis not present

## 2017-11-04 DIAGNOSIS — M9901 Segmental and somatic dysfunction of cervical region: Secondary | ICD-10-CM | POA: Diagnosis not present

## 2017-11-04 DIAGNOSIS — M50321 Other cervical disc degeneration at C4-C5 level: Secondary | ICD-10-CM | POA: Diagnosis not present

## 2017-11-04 DIAGNOSIS — M5387 Other specified dorsopathies, lumbosacral region: Secondary | ICD-10-CM | POA: Diagnosis not present

## 2017-11-06 DIAGNOSIS — M9901 Segmental and somatic dysfunction of cervical region: Secondary | ICD-10-CM | POA: Diagnosis not present

## 2017-11-06 DIAGNOSIS — M542 Cervicalgia: Secondary | ICD-10-CM | POA: Diagnosis not present

## 2017-11-06 DIAGNOSIS — M50321 Other cervical disc degeneration at C4-C5 level: Secondary | ICD-10-CM | POA: Diagnosis not present

## 2017-11-06 DIAGNOSIS — M9903 Segmental and somatic dysfunction of lumbar region: Secondary | ICD-10-CM | POA: Diagnosis not present

## 2017-11-06 DIAGNOSIS — M4606 Spinal enthesopathy, lumbar region: Secondary | ICD-10-CM | POA: Diagnosis not present

## 2017-11-06 DIAGNOSIS — M5387 Other specified dorsopathies, lumbosacral region: Secondary | ICD-10-CM | POA: Diagnosis not present

## 2017-11-06 DIAGNOSIS — M545 Low back pain: Secondary | ICD-10-CM | POA: Diagnosis not present

## 2017-11-06 DIAGNOSIS — M9902 Segmental and somatic dysfunction of thoracic region: Secondary | ICD-10-CM | POA: Diagnosis not present

## 2017-11-06 DIAGNOSIS — M9904 Segmental and somatic dysfunction of sacral region: Secondary | ICD-10-CM | POA: Diagnosis not present

## 2017-11-07 DIAGNOSIS — M5387 Other specified dorsopathies, lumbosacral region: Secondary | ICD-10-CM | POA: Diagnosis not present

## 2017-11-07 DIAGNOSIS — M545 Low back pain: Secondary | ICD-10-CM | POA: Diagnosis not present

## 2017-11-07 DIAGNOSIS — M542 Cervicalgia: Secondary | ICD-10-CM | POA: Diagnosis not present

## 2017-11-07 DIAGNOSIS — M9904 Segmental and somatic dysfunction of sacral region: Secondary | ICD-10-CM | POA: Diagnosis not present

## 2017-11-07 DIAGNOSIS — M50321 Other cervical disc degeneration at C4-C5 level: Secondary | ICD-10-CM | POA: Diagnosis not present

## 2017-11-07 DIAGNOSIS — M9901 Segmental and somatic dysfunction of cervical region: Secondary | ICD-10-CM | POA: Diagnosis not present

## 2017-11-07 DIAGNOSIS — M9902 Segmental and somatic dysfunction of thoracic region: Secondary | ICD-10-CM | POA: Diagnosis not present

## 2017-11-07 DIAGNOSIS — M4606 Spinal enthesopathy, lumbar region: Secondary | ICD-10-CM | POA: Diagnosis not present

## 2017-11-07 DIAGNOSIS — M9903 Segmental and somatic dysfunction of lumbar region: Secondary | ICD-10-CM | POA: Diagnosis not present

## 2017-11-11 DIAGNOSIS — M4606 Spinal enthesopathy, lumbar region: Secondary | ICD-10-CM | POA: Diagnosis not present

## 2017-11-11 DIAGNOSIS — M50321 Other cervical disc degeneration at C4-C5 level: Secondary | ICD-10-CM | POA: Diagnosis not present

## 2017-11-11 DIAGNOSIS — M5387 Other specified dorsopathies, lumbosacral region: Secondary | ICD-10-CM | POA: Diagnosis not present

## 2017-11-11 DIAGNOSIS — M9901 Segmental and somatic dysfunction of cervical region: Secondary | ICD-10-CM | POA: Diagnosis not present

## 2017-11-11 DIAGNOSIS — M545 Low back pain: Secondary | ICD-10-CM | POA: Diagnosis not present

## 2017-11-11 DIAGNOSIS — M9902 Segmental and somatic dysfunction of thoracic region: Secondary | ICD-10-CM | POA: Diagnosis not present

## 2017-11-11 DIAGNOSIS — M542 Cervicalgia: Secondary | ICD-10-CM | POA: Diagnosis not present

## 2017-11-11 DIAGNOSIS — M9904 Segmental and somatic dysfunction of sacral region: Secondary | ICD-10-CM | POA: Diagnosis not present

## 2017-11-11 DIAGNOSIS — M9903 Segmental and somatic dysfunction of lumbar region: Secondary | ICD-10-CM | POA: Diagnosis not present

## 2017-11-13 DIAGNOSIS — M9902 Segmental and somatic dysfunction of thoracic region: Secondary | ICD-10-CM | POA: Diagnosis not present

## 2017-11-13 DIAGNOSIS — M5387 Other specified dorsopathies, lumbosacral region: Secondary | ICD-10-CM | POA: Diagnosis not present

## 2017-11-13 DIAGNOSIS — M4606 Spinal enthesopathy, lumbar region: Secondary | ICD-10-CM | POA: Diagnosis not present

## 2017-11-13 DIAGNOSIS — M50321 Other cervical disc degeneration at C4-C5 level: Secondary | ICD-10-CM | POA: Diagnosis not present

## 2017-11-13 DIAGNOSIS — M9901 Segmental and somatic dysfunction of cervical region: Secondary | ICD-10-CM | POA: Diagnosis not present

## 2017-11-13 DIAGNOSIS — M9903 Segmental and somatic dysfunction of lumbar region: Secondary | ICD-10-CM | POA: Diagnosis not present

## 2017-11-13 DIAGNOSIS — M9904 Segmental and somatic dysfunction of sacral region: Secondary | ICD-10-CM | POA: Diagnosis not present

## 2017-11-13 DIAGNOSIS — M545 Low back pain: Secondary | ICD-10-CM | POA: Diagnosis not present

## 2017-11-13 DIAGNOSIS — M542 Cervicalgia: Secondary | ICD-10-CM | POA: Diagnosis not present

## 2017-11-14 DIAGNOSIS — M9903 Segmental and somatic dysfunction of lumbar region: Secondary | ICD-10-CM | POA: Diagnosis not present

## 2017-11-14 DIAGNOSIS — M4606 Spinal enthesopathy, lumbar region: Secondary | ICD-10-CM | POA: Diagnosis not present

## 2017-11-14 DIAGNOSIS — M545 Low back pain: Secondary | ICD-10-CM | POA: Diagnosis not present

## 2017-11-14 DIAGNOSIS — M9901 Segmental and somatic dysfunction of cervical region: Secondary | ICD-10-CM | POA: Diagnosis not present

## 2017-11-14 DIAGNOSIS — M9904 Segmental and somatic dysfunction of sacral region: Secondary | ICD-10-CM | POA: Diagnosis not present

## 2017-11-14 DIAGNOSIS — M9902 Segmental and somatic dysfunction of thoracic region: Secondary | ICD-10-CM | POA: Diagnosis not present

## 2017-11-14 DIAGNOSIS — M5387 Other specified dorsopathies, lumbosacral region: Secondary | ICD-10-CM | POA: Diagnosis not present

## 2017-11-14 DIAGNOSIS — M542 Cervicalgia: Secondary | ICD-10-CM | POA: Diagnosis not present

## 2017-11-14 DIAGNOSIS — M50321 Other cervical disc degeneration at C4-C5 level: Secondary | ICD-10-CM | POA: Diagnosis not present

## 2017-11-18 DIAGNOSIS — M9904 Segmental and somatic dysfunction of sacral region: Secondary | ICD-10-CM | POA: Diagnosis not present

## 2017-11-18 DIAGNOSIS — M4606 Spinal enthesopathy, lumbar region: Secondary | ICD-10-CM | POA: Diagnosis not present

## 2017-11-18 DIAGNOSIS — M5387 Other specified dorsopathies, lumbosacral region: Secondary | ICD-10-CM | POA: Diagnosis not present

## 2017-11-18 DIAGNOSIS — M9901 Segmental and somatic dysfunction of cervical region: Secondary | ICD-10-CM | POA: Diagnosis not present

## 2017-11-18 DIAGNOSIS — M9903 Segmental and somatic dysfunction of lumbar region: Secondary | ICD-10-CM | POA: Diagnosis not present

## 2017-11-18 DIAGNOSIS — M50321 Other cervical disc degeneration at C4-C5 level: Secondary | ICD-10-CM | POA: Diagnosis not present

## 2017-11-18 DIAGNOSIS — M545 Low back pain: Secondary | ICD-10-CM | POA: Diagnosis not present

## 2017-11-18 DIAGNOSIS — M542 Cervicalgia: Secondary | ICD-10-CM | POA: Diagnosis not present

## 2017-11-18 DIAGNOSIS — M9902 Segmental and somatic dysfunction of thoracic region: Secondary | ICD-10-CM | POA: Diagnosis not present

## 2017-11-21 DIAGNOSIS — M50321 Other cervical disc degeneration at C4-C5 level: Secondary | ICD-10-CM | POA: Diagnosis not present

## 2017-11-21 DIAGNOSIS — M4606 Spinal enthesopathy, lumbar region: Secondary | ICD-10-CM | POA: Diagnosis not present

## 2017-11-21 DIAGNOSIS — M9901 Segmental and somatic dysfunction of cervical region: Secondary | ICD-10-CM | POA: Diagnosis not present

## 2017-11-21 DIAGNOSIS — M9903 Segmental and somatic dysfunction of lumbar region: Secondary | ICD-10-CM | POA: Diagnosis not present

## 2017-11-21 DIAGNOSIS — M9904 Segmental and somatic dysfunction of sacral region: Secondary | ICD-10-CM | POA: Diagnosis not present

## 2017-11-21 DIAGNOSIS — M545 Low back pain: Secondary | ICD-10-CM | POA: Diagnosis not present

## 2017-11-21 DIAGNOSIS — M5387 Other specified dorsopathies, lumbosacral region: Secondary | ICD-10-CM | POA: Diagnosis not present

## 2017-11-21 DIAGNOSIS — M9902 Segmental and somatic dysfunction of thoracic region: Secondary | ICD-10-CM | POA: Diagnosis not present

## 2017-11-21 DIAGNOSIS — M542 Cervicalgia: Secondary | ICD-10-CM | POA: Diagnosis not present

## 2017-11-26 DIAGNOSIS — M545 Low back pain: Secondary | ICD-10-CM | POA: Diagnosis not present

## 2017-11-26 DIAGNOSIS — M9901 Segmental and somatic dysfunction of cervical region: Secondary | ICD-10-CM | POA: Diagnosis not present

## 2017-11-26 DIAGNOSIS — M4606 Spinal enthesopathy, lumbar region: Secondary | ICD-10-CM | POA: Diagnosis not present

## 2017-11-26 DIAGNOSIS — M5387 Other specified dorsopathies, lumbosacral region: Secondary | ICD-10-CM | POA: Diagnosis not present

## 2017-11-26 DIAGNOSIS — M542 Cervicalgia: Secondary | ICD-10-CM | POA: Diagnosis not present

## 2017-11-26 DIAGNOSIS — M9902 Segmental and somatic dysfunction of thoracic region: Secondary | ICD-10-CM | POA: Diagnosis not present

## 2017-11-26 DIAGNOSIS — M9903 Segmental and somatic dysfunction of lumbar region: Secondary | ICD-10-CM | POA: Diagnosis not present

## 2017-11-26 DIAGNOSIS — M50321 Other cervical disc degeneration at C4-C5 level: Secondary | ICD-10-CM | POA: Diagnosis not present

## 2017-11-26 DIAGNOSIS — M9904 Segmental and somatic dysfunction of sacral region: Secondary | ICD-10-CM | POA: Diagnosis not present

## 2017-11-28 DIAGNOSIS — M9901 Segmental and somatic dysfunction of cervical region: Secondary | ICD-10-CM | POA: Diagnosis not present

## 2017-11-28 DIAGNOSIS — M5387 Other specified dorsopathies, lumbosacral region: Secondary | ICD-10-CM | POA: Diagnosis not present

## 2017-11-28 DIAGNOSIS — M545 Low back pain: Secondary | ICD-10-CM | POA: Diagnosis not present

## 2017-11-28 DIAGNOSIS — M50321 Other cervical disc degeneration at C4-C5 level: Secondary | ICD-10-CM | POA: Diagnosis not present

## 2017-11-28 DIAGNOSIS — M542 Cervicalgia: Secondary | ICD-10-CM | POA: Diagnosis not present

## 2017-11-28 DIAGNOSIS — M9902 Segmental and somatic dysfunction of thoracic region: Secondary | ICD-10-CM | POA: Diagnosis not present

## 2017-11-28 DIAGNOSIS — M9903 Segmental and somatic dysfunction of lumbar region: Secondary | ICD-10-CM | POA: Diagnosis not present

## 2017-11-28 DIAGNOSIS — M4606 Spinal enthesopathy, lumbar region: Secondary | ICD-10-CM | POA: Diagnosis not present

## 2017-11-28 DIAGNOSIS — M9904 Segmental and somatic dysfunction of sacral region: Secondary | ICD-10-CM | POA: Diagnosis not present

## 2017-12-03 DIAGNOSIS — M545 Low back pain: Secondary | ICD-10-CM | POA: Diagnosis not present

## 2017-12-03 DIAGNOSIS — M542 Cervicalgia: Secondary | ICD-10-CM | POA: Diagnosis not present

## 2017-12-03 DIAGNOSIS — M9904 Segmental and somatic dysfunction of sacral region: Secondary | ICD-10-CM | POA: Diagnosis not present

## 2017-12-03 DIAGNOSIS — M9903 Segmental and somatic dysfunction of lumbar region: Secondary | ICD-10-CM | POA: Diagnosis not present

## 2017-12-03 DIAGNOSIS — M9902 Segmental and somatic dysfunction of thoracic region: Secondary | ICD-10-CM | POA: Diagnosis not present

## 2017-12-03 DIAGNOSIS — M9901 Segmental and somatic dysfunction of cervical region: Secondary | ICD-10-CM | POA: Diagnosis not present

## 2017-12-03 DIAGNOSIS — M50321 Other cervical disc degeneration at C4-C5 level: Secondary | ICD-10-CM | POA: Diagnosis not present

## 2017-12-03 DIAGNOSIS — M5387 Other specified dorsopathies, lumbosacral region: Secondary | ICD-10-CM | POA: Diagnosis not present

## 2017-12-03 DIAGNOSIS — M4606 Spinal enthesopathy, lumbar region: Secondary | ICD-10-CM | POA: Diagnosis not present

## 2017-12-09 DIAGNOSIS — M542 Cervicalgia: Secondary | ICD-10-CM | POA: Diagnosis not present

## 2017-12-09 DIAGNOSIS — M9902 Segmental and somatic dysfunction of thoracic region: Secondary | ICD-10-CM | POA: Diagnosis not present

## 2017-12-09 DIAGNOSIS — M50321 Other cervical disc degeneration at C4-C5 level: Secondary | ICD-10-CM | POA: Diagnosis not present

## 2017-12-09 DIAGNOSIS — M9904 Segmental and somatic dysfunction of sacral region: Secondary | ICD-10-CM | POA: Diagnosis not present

## 2017-12-09 DIAGNOSIS — M5387 Other specified dorsopathies, lumbosacral region: Secondary | ICD-10-CM | POA: Diagnosis not present

## 2017-12-09 DIAGNOSIS — M4606 Spinal enthesopathy, lumbar region: Secondary | ICD-10-CM | POA: Diagnosis not present

## 2017-12-09 DIAGNOSIS — M9901 Segmental and somatic dysfunction of cervical region: Secondary | ICD-10-CM | POA: Diagnosis not present

## 2017-12-09 DIAGNOSIS — M9903 Segmental and somatic dysfunction of lumbar region: Secondary | ICD-10-CM | POA: Diagnosis not present

## 2017-12-09 DIAGNOSIS — M545 Low back pain: Secondary | ICD-10-CM | POA: Diagnosis not present

## 2017-12-23 DIAGNOSIS — M50321 Other cervical disc degeneration at C4-C5 level: Secondary | ICD-10-CM | POA: Diagnosis not present

## 2017-12-23 DIAGNOSIS — M9904 Segmental and somatic dysfunction of sacral region: Secondary | ICD-10-CM | POA: Diagnosis not present

## 2017-12-23 DIAGNOSIS — M9901 Segmental and somatic dysfunction of cervical region: Secondary | ICD-10-CM | POA: Diagnosis not present

## 2017-12-23 DIAGNOSIS — M545 Low back pain: Secondary | ICD-10-CM | POA: Diagnosis not present

## 2017-12-23 DIAGNOSIS — M4606 Spinal enthesopathy, lumbar region: Secondary | ICD-10-CM | POA: Diagnosis not present

## 2017-12-23 DIAGNOSIS — M9902 Segmental and somatic dysfunction of thoracic region: Secondary | ICD-10-CM | POA: Diagnosis not present

## 2017-12-23 DIAGNOSIS — M542 Cervicalgia: Secondary | ICD-10-CM | POA: Diagnosis not present

## 2017-12-23 DIAGNOSIS — M5387 Other specified dorsopathies, lumbosacral region: Secondary | ICD-10-CM | POA: Diagnosis not present

## 2017-12-23 DIAGNOSIS — M9903 Segmental and somatic dysfunction of lumbar region: Secondary | ICD-10-CM | POA: Diagnosis not present

## 2017-12-25 DIAGNOSIS — R351 Nocturia: Secondary | ICD-10-CM | POA: Diagnosis not present

## 2017-12-25 DIAGNOSIS — Z125 Encounter for screening for malignant neoplasm of prostate: Secondary | ICD-10-CM | POA: Diagnosis not present

## 2017-12-25 DIAGNOSIS — N401 Enlarged prostate with lower urinary tract symptoms: Secondary | ICD-10-CM | POA: Diagnosis not present

## 2018-01-13 DIAGNOSIS — M5387 Other specified dorsopathies, lumbosacral region: Secondary | ICD-10-CM | POA: Diagnosis not present

## 2018-01-13 DIAGNOSIS — M50321 Other cervical disc degeneration at C4-C5 level: Secondary | ICD-10-CM | POA: Diagnosis not present

## 2018-01-13 DIAGNOSIS — Z6834 Body mass index (BMI) 34.0-34.9, adult: Secondary | ICD-10-CM | POA: Diagnosis not present

## 2018-01-13 DIAGNOSIS — M9903 Segmental and somatic dysfunction of lumbar region: Secondary | ICD-10-CM | POA: Diagnosis not present

## 2018-01-13 DIAGNOSIS — M542 Cervicalgia: Secondary | ICD-10-CM | POA: Diagnosis not present

## 2018-01-13 DIAGNOSIS — M9904 Segmental and somatic dysfunction of sacral region: Secondary | ICD-10-CM | POA: Diagnosis not present

## 2018-01-13 DIAGNOSIS — M9902 Segmental and somatic dysfunction of thoracic region: Secondary | ICD-10-CM | POA: Diagnosis not present

## 2018-01-13 DIAGNOSIS — J40 Bronchitis, not specified as acute or chronic: Secondary | ICD-10-CM | POA: Diagnosis not present

## 2018-01-13 DIAGNOSIS — M545 Low back pain: Secondary | ICD-10-CM | POA: Diagnosis not present

## 2018-01-13 DIAGNOSIS — M9901 Segmental and somatic dysfunction of cervical region: Secondary | ICD-10-CM | POA: Diagnosis not present

## 2018-01-13 DIAGNOSIS — J329 Chronic sinusitis, unspecified: Secondary | ICD-10-CM | POA: Diagnosis not present

## 2018-01-13 DIAGNOSIS — M4606 Spinal enthesopathy, lumbar region: Secondary | ICD-10-CM | POA: Diagnosis not present

## 2018-06-30 DIAGNOSIS — R351 Nocturia: Secondary | ICD-10-CM | POA: Diagnosis not present

## 2018-06-30 DIAGNOSIS — N401 Enlarged prostate with lower urinary tract symptoms: Secondary | ICD-10-CM | POA: Diagnosis not present

## 2018-07-31 DIAGNOSIS — Z23 Encounter for immunization: Secondary | ICD-10-CM | POA: Diagnosis not present

## 2018-09-09 DIAGNOSIS — R1084 Generalized abdominal pain: Secondary | ICD-10-CM | POA: Diagnosis not present

## 2018-10-29 DIAGNOSIS — E785 Hyperlipidemia, unspecified: Secondary | ICD-10-CM | POA: Diagnosis not present

## 2018-10-29 DIAGNOSIS — E119 Type 2 diabetes mellitus without complications: Secondary | ICD-10-CM | POA: Diagnosis not present

## 2018-10-29 DIAGNOSIS — Z79899 Other long term (current) drug therapy: Secondary | ICD-10-CM | POA: Diagnosis not present

## 2018-11-21 DIAGNOSIS — E785 Hyperlipidemia, unspecified: Secondary | ICD-10-CM | POA: Diagnosis not present

## 2018-11-21 DIAGNOSIS — T466X5A Adverse effect of antihyperlipidemic and antiarteriosclerotic drugs, initial encounter: Secondary | ICD-10-CM | POA: Diagnosis not present

## 2018-11-21 DIAGNOSIS — E1169 Type 2 diabetes mellitus with other specified complication: Secondary | ICD-10-CM | POA: Diagnosis not present

## 2018-11-21 DIAGNOSIS — Z0000 Encounter for general adult medical examination without abnormal findings: Secondary | ICD-10-CM | POA: Diagnosis not present

## 2018-11-21 DIAGNOSIS — M791 Myalgia, unspecified site: Secondary | ICD-10-CM | POA: Diagnosis not present

## 2019-01-12 DIAGNOSIS — R351 Nocturia: Secondary | ICD-10-CM | POA: Diagnosis not present

## 2019-01-12 DIAGNOSIS — Z125 Encounter for screening for malignant neoplasm of prostate: Secondary | ICD-10-CM | POA: Diagnosis not present

## 2019-01-12 DIAGNOSIS — N401 Enlarged prostate with lower urinary tract symptoms: Secondary | ICD-10-CM | POA: Diagnosis not present

## 2019-03-21 DIAGNOSIS — N400 Enlarged prostate without lower urinary tract symptoms: Secondary | ICD-10-CM | POA: Diagnosis not present

## 2019-03-21 DIAGNOSIS — I10 Essential (primary) hypertension: Secondary | ICD-10-CM | POA: Diagnosis not present

## 2019-04-15 DIAGNOSIS — H25811 Combined forms of age-related cataract, right eye: Secondary | ICD-10-CM | POA: Diagnosis not present

## 2019-04-15 DIAGNOSIS — Z01818 Encounter for other preprocedural examination: Secondary | ICD-10-CM | POA: Diagnosis not present

## 2019-04-15 DIAGNOSIS — H2511 Age-related nuclear cataract, right eye: Secondary | ICD-10-CM | POA: Diagnosis not present

## 2019-04-15 DIAGNOSIS — H5703 Miosis: Secondary | ICD-10-CM | POA: Diagnosis not present

## 2019-04-15 DIAGNOSIS — H25812 Combined forms of age-related cataract, left eye: Secondary | ICD-10-CM | POA: Diagnosis not present

## 2019-04-29 DIAGNOSIS — H2512 Age-related nuclear cataract, left eye: Secondary | ICD-10-CM | POA: Diagnosis not present

## 2019-04-29 DIAGNOSIS — H5703 Miosis: Secondary | ICD-10-CM | POA: Diagnosis not present

## 2019-04-29 DIAGNOSIS — H25812 Combined forms of age-related cataract, left eye: Secondary | ICD-10-CM | POA: Diagnosis not present

## 2019-06-19 DIAGNOSIS — Z09 Encounter for follow-up examination after completed treatment for conditions other than malignant neoplasm: Secondary | ICD-10-CM | POA: Diagnosis not present

## 2019-07-16 DIAGNOSIS — N401 Enlarged prostate with lower urinary tract symptoms: Secondary | ICD-10-CM | POA: Diagnosis not present

## 2019-07-16 DIAGNOSIS — R351 Nocturia: Secondary | ICD-10-CM | POA: Diagnosis not present

## 2019-07-30 DIAGNOSIS — Z23 Encounter for immunization: Secondary | ICD-10-CM | POA: Diagnosis not present

## 2019-10-22 DIAGNOSIS — E1169 Type 2 diabetes mellitus with other specified complication: Secondary | ICD-10-CM | POA: Diagnosis not present

## 2019-10-22 DIAGNOSIS — N400 Enlarged prostate without lower urinary tract symptoms: Secondary | ICD-10-CM | POA: Diagnosis not present

## 2019-10-23 DIAGNOSIS — Z8619 Personal history of other infectious and parasitic diseases: Secondary | ICD-10-CM

## 2019-10-23 DIAGNOSIS — L905 Scar conditions and fibrosis of skin: Secondary | ICD-10-CM

## 2019-10-23 HISTORY — DX: Personal history of other infectious and parasitic diseases: Z86.19

## 2019-10-23 HISTORY — DX: Scar conditions and fibrosis of skin: L90.5

## 2019-10-30 DIAGNOSIS — Z79899 Other long term (current) drug therapy: Secondary | ICD-10-CM | POA: Diagnosis not present

## 2019-10-30 DIAGNOSIS — E1169 Type 2 diabetes mellitus with other specified complication: Secondary | ICD-10-CM | POA: Diagnosis not present

## 2019-10-30 DIAGNOSIS — E785 Hyperlipidemia, unspecified: Secondary | ICD-10-CM | POA: Diagnosis not present

## 2020-01-13 DIAGNOSIS — R351 Nocturia: Secondary | ICD-10-CM | POA: Diagnosis not present

## 2020-01-13 DIAGNOSIS — N401 Enlarged prostate with lower urinary tract symptoms: Secondary | ICD-10-CM | POA: Diagnosis not present

## 2020-03-21 DIAGNOSIS — E1169 Type 2 diabetes mellitus with other specified complication: Secondary | ICD-10-CM | POA: Diagnosis not present

## 2020-03-21 DIAGNOSIS — I10 Essential (primary) hypertension: Secondary | ICD-10-CM | POA: Diagnosis not present

## 2020-03-21 DIAGNOSIS — M109 Gout, unspecified: Secondary | ICD-10-CM | POA: Diagnosis not present

## 2020-03-21 DIAGNOSIS — E1140 Type 2 diabetes mellitus with diabetic neuropathy, unspecified: Secondary | ICD-10-CM | POA: Diagnosis not present

## 2020-05-21 DIAGNOSIS — E1169 Type 2 diabetes mellitus with other specified complication: Secondary | ICD-10-CM | POA: Diagnosis not present

## 2020-05-21 DIAGNOSIS — E1140 Type 2 diabetes mellitus with diabetic neuropathy, unspecified: Secondary | ICD-10-CM | POA: Diagnosis not present

## 2020-05-21 DIAGNOSIS — M109 Gout, unspecified: Secondary | ICD-10-CM | POA: Diagnosis not present

## 2020-05-21 DIAGNOSIS — I10 Essential (primary) hypertension: Secondary | ICD-10-CM | POA: Diagnosis not present

## 2020-07-06 DIAGNOSIS — M71011 Abscess of bursa, right shoulder: Secondary | ICD-10-CM | POA: Diagnosis not present

## 2020-07-07 DIAGNOSIS — L723 Sebaceous cyst: Secondary | ICD-10-CM | POA: Diagnosis not present

## 2020-07-07 DIAGNOSIS — L089 Local infection of the skin and subcutaneous tissue, unspecified: Secondary | ICD-10-CM | POA: Diagnosis not present

## 2020-07-07 DIAGNOSIS — L02413 Cutaneous abscess of right upper limb: Secondary | ICD-10-CM | POA: Diagnosis not present

## 2020-07-07 DIAGNOSIS — M19011 Primary osteoarthritis, right shoulder: Secondary | ICD-10-CM | POA: Diagnosis not present

## 2020-07-11 DIAGNOSIS — M25511 Pain in right shoulder: Secondary | ICD-10-CM | POA: Diagnosis not present

## 2020-07-11 DIAGNOSIS — L02413 Cutaneous abscess of right upper limb: Secondary | ICD-10-CM | POA: Diagnosis not present

## 2020-07-11 DIAGNOSIS — L03113 Cellulitis of right upper limb: Secondary | ICD-10-CM | POA: Diagnosis not present

## 2020-07-11 DIAGNOSIS — A419 Sepsis, unspecified organism: Secondary | ICD-10-CM | POA: Diagnosis not present

## 2020-07-13 DIAGNOSIS — Z96612 Presence of left artificial shoulder joint: Secondary | ICD-10-CM | POA: Diagnosis not present

## 2020-07-13 DIAGNOSIS — T8453XA Infection and inflammatory reaction due to internal right knee prosthesis, initial encounter: Secondary | ICD-10-CM | POA: Diagnosis not present

## 2020-07-13 DIAGNOSIS — L02413 Cutaneous abscess of right upper limb: Secondary | ICD-10-CM | POA: Diagnosis not present

## 2020-07-13 DIAGNOSIS — Z96611 Presence of right artificial shoulder joint: Secondary | ICD-10-CM | POA: Diagnosis not present

## 2020-07-15 DIAGNOSIS — M25511 Pain in right shoulder: Secondary | ICD-10-CM | POA: Diagnosis not present

## 2020-07-15 DIAGNOSIS — Z96611 Presence of right artificial shoulder joint: Secondary | ICD-10-CM | POA: Diagnosis not present

## 2020-07-19 DIAGNOSIS — R351 Nocturia: Secondary | ICD-10-CM | POA: Diagnosis not present

## 2020-07-19 DIAGNOSIS — N401 Enlarged prostate with lower urinary tract symptoms: Secondary | ICD-10-CM | POA: Diagnosis not present

## 2020-07-29 DIAGNOSIS — Z96611 Presence of right artificial shoulder joint: Secondary | ICD-10-CM | POA: Diagnosis not present

## 2020-07-29 DIAGNOSIS — Z471 Aftercare following joint replacement surgery: Secondary | ICD-10-CM | POA: Diagnosis not present

## 2020-08-12 DIAGNOSIS — Z96611 Presence of right artificial shoulder joint: Secondary | ICD-10-CM | POA: Diagnosis not present

## 2020-08-20 DIAGNOSIS — E1169 Type 2 diabetes mellitus with other specified complication: Secondary | ICD-10-CM | POA: Diagnosis not present

## 2020-08-20 DIAGNOSIS — M159 Polyosteoarthritis, unspecified: Secondary | ICD-10-CM | POA: Diagnosis not present

## 2020-08-20 DIAGNOSIS — E7849 Other hyperlipidemia: Secondary | ICD-10-CM | POA: Diagnosis not present

## 2020-08-20 DIAGNOSIS — I10 Essential (primary) hypertension: Secondary | ICD-10-CM | POA: Diagnosis not present

## 2020-09-16 DIAGNOSIS — M109 Gout, unspecified: Secondary | ICD-10-CM | POA: Diagnosis not present

## 2020-10-17 DIAGNOSIS — Z96611 Presence of right artificial shoulder joint: Secondary | ICD-10-CM | POA: Diagnosis not present

## 2020-10-17 DIAGNOSIS — E1169 Type 2 diabetes mellitus with other specified complication: Secondary | ICD-10-CM | POA: Diagnosis not present

## 2020-10-17 DIAGNOSIS — E1140 Type 2 diabetes mellitus with diabetic neuropathy, unspecified: Secondary | ICD-10-CM | POA: Diagnosis not present

## 2020-10-17 DIAGNOSIS — E785 Hyperlipidemia, unspecified: Secondary | ICD-10-CM | POA: Diagnosis not present

## 2020-10-17 DIAGNOSIS — M65331 Trigger finger, right middle finger: Secondary | ICD-10-CM | POA: Diagnosis not present

## 2021-01-10 ENCOUNTER — Observation Stay: Admission: AD | Admit: 2021-01-10 | Payer: PRIVATE HEALTH INSURANCE | Admitting: Internal Medicine

## 2021-01-10 DIAGNOSIS — N3289 Other specified disorders of bladder: Secondary | ICD-10-CM | POA: Diagnosis not present

## 2021-01-10 DIAGNOSIS — Z043 Encounter for examination and observation following other accident: Secondary | ICD-10-CM | POA: Diagnosis not present

## 2021-01-10 DIAGNOSIS — Z20822 Contact with and (suspected) exposure to covid-19: Secondary | ICD-10-CM | POA: Diagnosis not present

## 2021-01-10 DIAGNOSIS — I10 Essential (primary) hypertension: Secondary | ICD-10-CM | POA: Diagnosis not present

## 2021-01-10 DIAGNOSIS — R4020 Unspecified coma: Secondary | ICD-10-CM | POA: Diagnosis not present

## 2021-01-10 DIAGNOSIS — R519 Headache, unspecified: Secondary | ICD-10-CM | POA: Diagnosis not present

## 2021-01-10 DIAGNOSIS — I7090 Unspecified atherosclerosis: Secondary | ICD-10-CM | POA: Diagnosis not present

## 2021-01-10 DIAGNOSIS — S2242XA Multiple fractures of ribs, left side, initial encounter for closed fracture: Secondary | ICD-10-CM | POA: Diagnosis not present

## 2021-01-10 DIAGNOSIS — S2249XA Multiple fractures of ribs, unspecified side, initial encounter for closed fracture: Secondary | ICD-10-CM | POA: Diagnosis not present

## 2021-01-10 DIAGNOSIS — E119 Type 2 diabetes mellitus without complications: Secondary | ICD-10-CM | POA: Diagnosis not present

## 2021-01-10 DIAGNOSIS — I771 Stricture of artery: Secondary | ICD-10-CM | POA: Diagnosis not present

## 2021-01-18 DIAGNOSIS — E1151 Type 2 diabetes mellitus with diabetic peripheral angiopathy without gangrene: Secondary | ICD-10-CM | POA: Diagnosis not present

## 2021-01-18 DIAGNOSIS — N400 Enlarged prostate without lower urinary tract symptoms: Secondary | ICD-10-CM | POA: Diagnosis not present

## 2021-01-18 DIAGNOSIS — E785 Hyperlipidemia, unspecified: Secondary | ICD-10-CM | POA: Diagnosis not present

## 2021-01-18 DIAGNOSIS — R2681 Unsteadiness on feet: Secondary | ICD-10-CM | POA: Diagnosis not present

## 2021-01-18 DIAGNOSIS — Z9181 History of falling: Secondary | ICD-10-CM | POA: Diagnosis not present

## 2021-01-18 DIAGNOSIS — I2510 Atherosclerotic heart disease of native coronary artery without angina pectoris: Secondary | ICD-10-CM | POA: Diagnosis not present

## 2021-01-18 DIAGNOSIS — I10 Essential (primary) hypertension: Secondary | ICD-10-CM | POA: Diagnosis not present

## 2021-01-18 DIAGNOSIS — M1990 Unspecified osteoarthritis, unspecified site: Secondary | ICD-10-CM | POA: Diagnosis not present

## 2021-01-18 DIAGNOSIS — H2520 Age-related cataract, morgagnian type, unspecified eye: Secondary | ICD-10-CM | POA: Diagnosis not present

## 2021-01-18 DIAGNOSIS — M109 Gout, unspecified: Secondary | ICD-10-CM | POA: Diagnosis not present

## 2021-01-18 DIAGNOSIS — M6281 Muscle weakness (generalized): Secondary | ICD-10-CM | POA: Diagnosis not present

## 2021-01-18 DIAGNOSIS — W19XXXD Unspecified fall, subsequent encounter: Secondary | ICD-10-CM | POA: Diagnosis not present

## 2021-01-18 DIAGNOSIS — G9009 Other idiopathic peripheral autonomic neuropathy: Secondary | ICD-10-CM | POA: Diagnosis not present

## 2021-01-18 DIAGNOSIS — S2249XA Multiple fractures of ribs, unspecified side, initial encounter for closed fracture: Secondary | ICD-10-CM | POA: Diagnosis not present

## 2021-01-18 DIAGNOSIS — R278 Other lack of coordination: Secondary | ICD-10-CM | POA: Diagnosis not present

## 2021-01-18 DIAGNOSIS — M818 Other osteoporosis without current pathological fracture: Secondary | ICD-10-CM | POA: Diagnosis not present

## 2021-01-18 DIAGNOSIS — S2249XD Multiple fractures of ribs, unspecified side, subsequent encounter for fracture with routine healing: Secondary | ICD-10-CM | POA: Diagnosis not present

## 2021-01-18 DIAGNOSIS — I119 Hypertensive heart disease without heart failure: Secondary | ICD-10-CM | POA: Diagnosis not present

## 2021-01-18 DIAGNOSIS — E119 Type 2 diabetes mellitus without complications: Secondary | ICD-10-CM | POA: Diagnosis not present

## 2021-01-18 DIAGNOSIS — T1490XD Injury, unspecified, subsequent encounter: Secondary | ICD-10-CM | POA: Diagnosis not present

## 2021-01-18 DIAGNOSIS — R262 Difficulty in walking, not elsewhere classified: Secondary | ICD-10-CM | POA: Diagnosis not present

## 2021-01-19 DIAGNOSIS — E1151 Type 2 diabetes mellitus with diabetic peripheral angiopathy without gangrene: Secondary | ICD-10-CM | POA: Diagnosis not present

## 2021-01-19 DIAGNOSIS — M1990 Unspecified osteoarthritis, unspecified site: Secondary | ICD-10-CM | POA: Diagnosis not present

## 2021-01-19 DIAGNOSIS — I119 Hypertensive heart disease without heart failure: Secondary | ICD-10-CM | POA: Diagnosis not present

## 2021-01-19 DIAGNOSIS — R262 Difficulty in walking, not elsewhere classified: Secondary | ICD-10-CM | POA: Diagnosis not present

## 2021-02-09 DIAGNOSIS — S2242XD Multiple fractures of ribs, left side, subsequent encounter for fracture with routine healing: Secondary | ICD-10-CM | POA: Diagnosis not present

## 2021-02-09 DIAGNOSIS — I25118 Atherosclerotic heart disease of native coronary artery with other forms of angina pectoris: Secondary | ICD-10-CM | POA: Diagnosis not present

## 2021-02-09 DIAGNOSIS — S15101D Unspecified injury of right vertebral artery, subsequent encounter: Secondary | ICD-10-CM | POA: Diagnosis not present

## 2021-02-09 DIAGNOSIS — I6523 Occlusion and stenosis of bilateral carotid arteries: Secondary | ICD-10-CM | POA: Diagnosis not present

## 2021-02-22 DIAGNOSIS — S15101D Unspecified injury of right vertebral artery, subsequent encounter: Secondary | ICD-10-CM | POA: Diagnosis not present

## 2021-02-22 DIAGNOSIS — S2242XD Multiple fractures of ribs, left side, subsequent encounter for fracture with routine healing: Secondary | ICD-10-CM | POA: Diagnosis not present

## 2021-02-22 DIAGNOSIS — I6523 Occlusion and stenosis of bilateral carotid arteries: Secondary | ICD-10-CM | POA: Diagnosis not present

## 2021-02-22 DIAGNOSIS — I6503 Occlusion and stenosis of bilateral vertebral arteries: Secondary | ICD-10-CM | POA: Diagnosis not present

## 2021-02-28 ENCOUNTER — Other Ambulatory Visit: Payer: Self-pay

## 2021-02-28 DIAGNOSIS — M109 Gout, unspecified: Secondary | ICD-10-CM | POA: Insufficient documentation

## 2021-03-08 ENCOUNTER — Encounter: Payer: HMO | Admitting: Vascular Surgery

## 2021-03-17 ENCOUNTER — Ambulatory Visit (INDEPENDENT_AMBULATORY_CARE_PROVIDER_SITE_OTHER): Payer: HMO | Admitting: Vascular Surgery

## 2021-03-17 ENCOUNTER — Encounter: Payer: Self-pay | Admitting: Vascular Surgery

## 2021-03-17 ENCOUNTER — Other Ambulatory Visit: Payer: Self-pay

## 2021-03-17 VITALS — BP 149/75 | HR 68 | Temp 98.7°F | Resp 20 | Ht 68.0 in | Wt 221.0 lb

## 2021-03-17 DIAGNOSIS — I6521 Occlusion and stenosis of right carotid artery: Secondary | ICD-10-CM | POA: Diagnosis not present

## 2021-03-17 NOTE — Progress Notes (Signed)
Patient ID: Jermaine Hester, male   DOB: Jan 09, 1930, 87 y.o.   MRN: [id number]  Reason for Consult: No chief complaint on file.   Referred by Street, [place], *  Subjective:     HPI:  Jermaine Hester is a 87 y.o. male recently fell underwent CT angio of his neck and head demonstrated severe right carotid artery stenosis.  He denies any history of stroke, TIA or amaurosis.  Patient does take aspirin does not take any Plavix and states that he cannot take statins.  He is a former smoker quit many years ago.  He has well-controlled diabetes hyperlipidemia as well as hypertension.  No personal or family history of aneurysm disease.  Past Medical History:  Diagnosis Date  . Arthropathy of right shoulder   . Benign essential hypertension   . BPH with obstruction/lower urinary tract symptoms   . Diabetic neuropathy ([place])   . Dyslipidemia   . Dyslipidemia   . History of fungal skin infection 2021   Significant over joint  . ICAO (internal carotid artery occlusion), bilateral    Noted on recent CTA scans  . Idiopathic chronic gout of multiple sites without tophus   . Morbid obesity ([place])   . Primary localized osteoarthrosis of multiple sites   . Psoriasis   . Scar tissue 2021  . Statin myopathy   . Type 2 diabetes mellitus with hyperlipidemia ([place])     Short Social History:  Social History   Tobacco Use  . Smoking status: Former Smoker    Types: Cigarettes  . Smokeless tobacco: Not on file  Substance Use Topics  . Alcohol use: Never    Allergies  Allergen Reactions  . Penicillins Itching and Rash  . Sulfa Antibiotics Itching and Rash  . Morphine And Related Other (See Comments)    Current Outpatient Medications  Medication Sig Dispense Refill  . acetaminophen (TYLENOL) 500 MG tablet Take by mouth.    Marland Kitchen allopurinol (ZYLOPRIM) 100 MG tablet     . amLODipine (NORVASC) 10 MG tablet     . amoxicillin (AMOXIL) 500 MG capsule     . ascorbic acid (VITAMIN C) 1000 MG  tablet Take by mouth.    Marland Kitchen aspirin 325 MG EC tablet Take 1 tablet by mouth daily.    . B Complex Vitamins (VITAMIN B COMPLEX) TABS Take 1 tablet by mouth daily.    . Blood Glucose Monitoring Suppl (ONE TOUCH ULTRA 2) w/Device KIT See admin instructions.    . Calcium Carbonate-Vitamin D 600-125 MG-UNIT TABS Take by mouth.    . Cholecalciferol 25 MCG (1000 UT) tablet Take by mouth.    . clindamycin (CLEOCIN) 150 MG capsule Take 4 capsules by mouth 1 hour prior to dental appointment    . Coenzyme Q10 200 MG capsule Take 1 tablet by mouth every morning.    . enalapril (VASOTEC) 10 MG tablet     . enoxaparin (LOVENOX) 40 MG/0.4ML injection Inject into the skin.    . Ferrous Sulfate 142 (45 Fe) MG TBCR Take 1 tablet by mouth every morning.    . Gabapentin, Once-Daily, 300 MG TABS Take by mouth.    . indomethacin (INDOCIN) 50 MG capsule Take 50 mg by mouth 3 (three) times daily as needed.    . Lancets (ONETOUCH DELICA PLUS EPPIRJ18A) MISC USE 1 TO CHECK GLUCOSE ONCE DAILY    . metFORMIN (GLUCOPHAGE) 500 MG tablet     . mupirocin ointment (BACTROBAN) 2 % Apply topically  2 (two) times daily.    . Omega-3 Fatty Acids (OMEGA-3 2100) 1050 MG CAPS Take 1 capsule by mouth daily.    . ondansetron (ZOFRAN) 4 MG tablet Take by mouth.    Glory Rosebush ULTRA test strip USE 1 STRIP TO CHECK GLUCOSE ONCE DAILY    . pioglitazone (ACTOS) 30 MG tablet Take by mouth.    . polyethylene glycol powder (GLYCOLAX/MIRALAX) 17 GM/SCOOP powder Take by mouth.    . tamsulosin (FLOMAX) 0.4 MG CAPS capsule Take by mouth.    . triamcinolone cream (KENALOG) 0.1 % Apply topically.     No current facility-administered medications for this visit.    Review of Systems  Constitutional:  Constitutional negative. HENT: HENT negative.  Eyes: Eyes negative.  Respiratory: Respiratory negative.  Cardiovascular: Cardiovascular negative.  GI: Gastrointestinal negative.  Musculoskeletal: Musculoskeletal negative.  Skin: Skin negative.   Neurological: Neurological negative. Hematologic: Hematologic/lymphatic negative.  Psychiatric: Psychiatric negative.        Objective:  Objective   Vitals:   03/17/21 0913 03/17/21 0916  BP: (!) 157/75 (!) 149/75  Pulse: 68   Resp: 20   Temp: 98.7 F (37.1 C)   SpO2: 97%     Physical Exam HENT:     Head: Normocephalic.     Nose:     Comments: Wearing a mask Eyes:     Pupils: Pupils are equal, round, and reactive to light.  Neck:     Vascular: No carotid bruit.  Cardiovascular:     Rate and Rhythm: Normal rate.     Pulses:          Radial pulses are 2+ on the right side and 2+ on the left side.       Popliteal pulses are 2+ on the right side and 2+ on the left side.  Abdominal:     General: Abdomen is flat.     Palpations: Abdomen is soft.  Musculoskeletal:        General: Normal range of motion.  Skin:    General: Skin is warm and dry.     Capillary Refill: Capillary refill takes less than 2 seconds.  Neurological:     Mental Status: He is alert.  Psychiatric:        Mood and Affect: Mood normal.        Behavior: Behavior normal.        Thought Content: Thought content normal.        Judgment: Judgment normal.     Data: CTA neck IMPRESSION: Prominent calcified plaque within the proximal left subclavian artery with apparent stenosis of up to 80%. However, this apparent stenosis could be exaggerated by blooming artifact from irregular calcified plaque at this site.  Prominent calcified plaque within the right carotid bifurcation and proximal ICA. Resultant severe (greater than 90%) stenosis at the origin of the right ICA.  The left CCA and ICA are patent within the neck without hemodynamically significant stenosis.  Vertebral arteries patent within the neck. Prominent streak and beam hardening artifact precludes adequate evaluation of the origin of the right vertebral artery. There is calcified plaque at the origin of the right vertebral artery  with incompletely assessed stenosis. Additionally, there is high-grade narrowing of the right vertebral artery at the C1-C2 level due to mass effect from adjacent osteophytes.     Assessment/Plan:     87 year old male with severe asymptomatic right carotid artery stenosis.  We reviewed his CT scan today and discussed his options for treatment.  I have discussed that I would not recommend stenting given his high degree of calcium.  We discussed medical therapy with adding Plavix and statin versus carotid endarterectomy which would not require Plavix but I will still have him evaluated for consideration of statin.  We will get cardiac clearance and then have a phone call in 3 to 4 weeks to discuss either medical therapy moving forward versus carotid endarterectomy on the right.  I discussed the risk benefits and alternatives of carotid endarterectomy and they demonstrate good understanding     Waynetta Sandy MD [facility]

## 2021-04-07 DIAGNOSIS — M1A09X0 Idiopathic chronic gout, multiple sites, without tophus (tophi): Secondary | ICD-10-CM | POA: Insufficient documentation

## 2021-04-07 DIAGNOSIS — E1140 Type 2 diabetes mellitus with diabetic neuropathy, unspecified: Secondary | ICD-10-CM | POA: Insufficient documentation

## 2021-04-07 DIAGNOSIS — G720 Drug-induced myopathy: Secondary | ICD-10-CM | POA: Insufficient documentation

## 2021-04-07 DIAGNOSIS — M1991 Primary osteoarthritis, unspecified site: Secondary | ICD-10-CM | POA: Insufficient documentation

## 2021-04-07 DIAGNOSIS — E785 Hyperlipidemia, unspecified: Secondary | ICD-10-CM | POA: Insufficient documentation

## 2021-04-07 DIAGNOSIS — N138 Other obstructive and reflux uropathy: Secondary | ICD-10-CM | POA: Insufficient documentation

## 2021-04-07 DIAGNOSIS — M19011 Primary osteoarthritis, right shoulder: Secondary | ICD-10-CM | POA: Insufficient documentation

## 2021-04-07 DIAGNOSIS — L409 Psoriasis, unspecified: Secondary | ICD-10-CM | POA: Insufficient documentation

## 2021-04-07 DIAGNOSIS — N401 Enlarged prostate with lower urinary tract symptoms: Secondary | ICD-10-CM | POA: Insufficient documentation

## 2021-04-07 DIAGNOSIS — I6523 Occlusion and stenosis of bilateral carotid arteries: Secondary | ICD-10-CM | POA: Insufficient documentation

## 2021-04-17 DIAGNOSIS — N401 Enlarged prostate with lower urinary tract symptoms: Secondary | ICD-10-CM | POA: Diagnosis not present

## 2021-04-17 DIAGNOSIS — R351 Nocturia: Secondary | ICD-10-CM | POA: Diagnosis not present

## 2021-04-19 NOTE — Progress Notes (Deleted)
Cardiology Office Note:    Date:  04/19/2021   ID:  Jermaine Hester, DOB 25-May-1930, MRN [id number]  PCP:  Street, Stephanie Coup, MD  Cardiologist:  Norman Herrlich, MD    Referring MD: [address], Stephanie Coup, *    ASSESSMENT:    No diagnosis found. PLAN:    In order of problems listed above:  ***   Next appointment: ***   Medication Adjustments/Labs and Tests Ordered: Current medicines are reviewed at length with the patient today.  Concerns regarding medicines are outlined above.  No orders of the defined types were placed in this encounter.  No orders of the defined types were placed in this encounter.   No chief complaint on file.   History of Present Illness:    Jermaine Hester is a 87 y.o. male with a hx of *** last seen ***. Compliance with diet, lifestyle and medications: *** Past Medical History:  Diagnosis Date   Arthropathy of right shoulder    Benign essential hypertension    BPH with obstruction/lower urinary tract symptoms    Diabetic neuropathy ([facility])    Dyslipidemia    Dyslipidemia    History of fungal skin infection 2021   Significant over joint   ICAO (internal carotid artery occlusion), bilateral    Noted on recent CTA scans   Idiopathic chronic gout of multiple sites without tophus    Morbid obesity ([facility])    Primary localized osteoarthrosis of multiple sites    Psoriasis    Scar tissue 2021   Statin myopathy    Type 2 diabetes mellitus with hyperlipidemia [facility])     Past Surgical History:  Procedure Laterality Date   MULTIPLE ORTHOPEDIC PROCEDURES     ORIF FEMUR FRACTURE Right 01/29/2016   S/P FALL.  DR. Liliane Shi, [facility].   REPLACEMENT TOTAL KNEE     STATUS POST SHOULDER REPLACEMENT     STATUS POST SHOULDER REPLACEMENT AND REVISION ROTATOR CUFF SURGERY     TOTAL HIP ARTHROPLASTY      Current Medications: No outpatient medications have been marked as taking for the 04/20/21 encounter (Appointment) with Baldo Daub, MD.      Allergies:   Penicillins, Sulfa antibiotics, Morphine and related, and Sulfamethoxazole-trimethoprim   Social History   Socioeconomic History   Marital status: Married    Spouse name: Not on file   Number of children: Not on file   Years of education: Not on file   Highest education level: Not on file  Occupational History   Not on file  Tobacco Use   Smoking status: Former    Pack years: 0.00    Types: Cigarettes   Smokeless tobacco: Never  Vaping Use   Vaping Use: Never used  Substance and Sexual Activity   Alcohol use: Never   Drug use: Never   Sexual activity: Not on file  Other Topics Concern   Not on file  Social History Narrative   Not on file   Social Determinants of Health   Financial Resource Strain: Not on file  Food Insecurity: Not on file  Transportation Needs: Not on file  Physical Activity: Not on file  Stress: Not on file  Social Connections: Not on file     Family History: The patient's ***family history is not on file. ROS:   Please see the history of present illness.    All other systems reviewed and are negative.  EKGs/Labs/Other Studies Reviewed:    The following studies were reviewed today:  EKG:  EKG ordered today and personally reviewed.  The ekg ordered today demonstrates ***  Recent Labs: No results found for requested labs within last 8760 hours.  Recent Lipid Panel No results found for: CHOL, TRIG, HDL, CHOLHDL, VLDL, LDLCALC, LDLDIRECT  Physical Exam:    VS:  There were no vitals taken for this visit.    Wt Readings from Last 3 Encounters:  03/17/21 221 lb (100.2 kg)     GEN: *** Well nourished, well developed in no acute distress HEENT: Normal NECK: No JVD; No carotid bruits LYMPHATICS: No lymphadenopathy CARDIAC: ***RRR, no murmurs, rubs, gallops RESPIRATORY:  Clear to auscultation without rales, wheezing or rhonchi  ABDOMEN: Soft, non-tender, non-distended MUSCULOSKELETAL:  No edema; No deformity  SKIN: Warm  and dry NEUROLOGIC:  Alert and oriented x 3 PSYCHIATRIC:  Normal affect    Signed, Norman Herrlich, MD  04/19/2021 9:01 AM    [facility]

## 2021-04-19 NOTE — Progress Notes (Addendum)
Cardiology Office Note:    Date:  04/20/2021   ID:  Jermaine Hester, DOB Nov 17, 1929, MRN [id number]  PCP:  Jermaine Hester, Jermaine Mt, MD  Cardiologist:  Jermaine More, MD   Referring MD: [address] Jermaine Hester, *  ASSESSMENT:    1. Preoperative cardiovascular examination   2. Stenosis of right carotid artery   3. Essential hypertension   4. Dyslipidemia    PLAN:    In order of problems listed above:  From a cardiology perspective he has no known heart disease but is short of breath with activity had a previous abnormal EKG suggesting possible infarction and will undergo echocardiogram my office.  My xpectation is it will be eassuring at that point proceed with his planned elective surgery without further cardiology evaluation.  His EKG is normal without any conduction abnormality and I do think he requires ambulatory heart rhythm monitoring at this time.  I do not see an indication for an ischemia evaluation.  Postoperatively I will place in a monitored bed EKG postoperative day 1 continue his usual medications and if there is any problems contact heart care to participate in his hospital course. His echocardiogram shows normal cardiac function no evidence of preceding myocardial infarction Proceed with his planned vascular surgery.  Awaiting carotid endarterectomy Blood pressures at target continue current treatment calcium channel blocker and ACE inhibitor Will contact him and have him initiate a statin rosuvastatin 10 mg daily with his vascular disease.  Next appointment 6 months   Medication Adjustments/Labs and Tests Ordered: Current medicines are reviewed at length with the patient today.  Concerns regarding medicines are outlined above.  Orders Placed This Encounter  Procedures   EKG 12-Lead   ECHOCARDIOGRAM COMPLETE    Meds ordered this encounter  Medications   rosuvastatin (CRESTOR) 10 MG tablet    Sig: Take 1 tablet (10 mg total) by mouth daily.    Dispense:  90 tablet     Refill:  3      Chief Complaint  Patient presents with   Pre-op Exam     History of Present Illness:    Jermaine Hester is a 87 y.o. male who is being seen today for preoperative cardiology evaluation prior to CEA at the request of Jermaine Hester, Jermaine Hester, *.  He has severe asymptomatic right carotid artery stenosis and was advised CEA.  He has a background history of type 2 diabetes with neuropathy and dyslipidemia with a history of statin myopathy.  Chart review shows no cardiology interactions or testing within epic.  CT angiography of the neck 01/17/2021 showed heavily calcified stenosis greater than 70% proximal right ICA and tortuosity luminal irregularity and stenosis right vertebral artery at the level of C2.  His revised cardiac risk score is 0, class I.   EKG 01/17/2021 Jermaine Hester showed sinus rhythm possible LVH nonspecific T waves inferior infarction age-indeterminate  He was admitted [facility] Hester hospital 01/10/2021 to 01/18/2021 with fall trauma multiple fractured ribs was found to have a heavily calcified plaque at the carotid bifurcation with high-grade stenosis leading to vascular surgery evaluation.  He is unsure of the events of the episode whether he had a fall or whether he had syncope.  He had no arrhythmia during the hospitalization.  He has had multiple orthopedic interventions both shoulders hips and is limited in his activities but still has a normal exercise tolerance and can go up and down the stairs to his basement without stopping.  He does have  exertional shortness of breath when he walks about 200 foot.  No edema orthopnea chest pain palpitation or previous syncope.  He has known history of congenital rheumatic heart disease or atrial fibrillation.  His daughter is present participates in evaluation decision making. Past Medical History:  Diagnosis Date   Arthropathy of right shoulder    Benign essential hypertension    BPH with  obstruction/lower urinary tract symptoms    Diabetic neuropathy (HCC)    Dyslipidemia    Dyslipidemia    History of fungal skin infection 2021   Significant over joint   ICAO (internal carotid artery occlusion), bilateral    Noted on recent CTA scans   Idiopathic chronic gout of multiple sites without tophus    Morbid obesity ([place])    Primary localized osteoarthrosis of multiple sites    Psoriasis    Scar tissue 2021   Statin myopathy    Type 2 diabetes mellitus with hyperlipidemia [facility])     Past Surgical History:  Procedure Laterality Date   MULTIPLE ORTHOPEDIC PROCEDURES     ORIF FEMUR FRACTURE Right 01/29/2016   S/P FALL.  DR. Lovena Hester, [facility].   REPLACEMENT TOTAL KNEE     STATUS POST SHOULDER REPLACEMENT     STATUS POST SHOULDER REPLACEMENT AND REVISION ROTATOR CUFF SURGERY     TOTAL HIP ARTHROPLASTY      Current Medications: Current Meds  Medication Sig   acetaminophen (TYLENOL) 500 MG tablet Take 500 mg by mouth every 6 (six) hours as needed for mild pain.   allopurinol (ZYLOPRIM) 100 MG tablet Take 100 mg by mouth 2 (two) times daily.   amLODipine (NORVASC) 10 MG tablet Take 10 mg by mouth daily.   ascorbic acid (VITAMIN C) 1000 MG tablet Take by mouth.   aspirin 325 MG tablet Take 325 mg by mouth daily.   B Complex Vitamins (VITAMIN B COMPLEX) TABS Take 1 tablet by mouth daily.   Blood Glucose Monitoring Suppl (ONE TOUCH ULTRA 2) w/Device KIT See admin instructions.   Calcium Carbonate-Vitamin D 600-125 MG-UNIT TABS Take by mouth.   Cholecalciferol 25 MCG (1000 UT) tablet Take by mouth.   Coenzyme Q10 200 MG capsule Take 1 tablet by mouth every morning.   enalapril (VASOTEC) 10 MG tablet Take 10 mg by mouth daily.   Gabapentin, Once-Daily, 300 MG TABS Take by mouth.   Lancets (ONETOUCH DELICA PLUS [id number]) MISC USE 1 TO CHECK GLUCOSE ONCE DAILY   Magnesium Oxide 420 MG TABS Take 1 tablet by mouth every morning.   metFORMIN (GLUCOPHAGE) 500 MG tablet Take 500 mg  by mouth. 1000 mg every am and 500 mg evening   Omega-3 Fatty Acids (OMEGA-3 2100) 1050 MG CAPS Take 1 capsule by mouth daily.   ONETOUCH ULTRA test strip USE 1 STRIP TO CHECK GLUCOSE ONCE DAILY   pioglitazone (ACTOS) 30 MG tablet Take by mouth.   rosuvastatin (CRESTOR) 10 MG tablet Take 1 tablet (10 mg total) by mouth daily.   tamsulosin (FLOMAX) 0.4 MG CAPS capsule Take by mouth.     Allergies:   Penicillins, Sulfa antibiotics, Morphine and related, and Sulfamethoxazole-trimethoprim   Social History   Socioeconomic History   Marital status: Married    Spouse name: Not on file   Number of children: Not on file   Years of education: Not on file   Highest education level: Not on file  Occupational History   Not on file  Tobacco Use   Smoking status: Former  Pack years: 0.00    Types: Cigarettes   Smokeless tobacco: Never  Vaping Use   Vaping Use: Never used  Substance and Sexual Activity   Alcohol use: Never   Drug use: Never   Sexual activity: Not on file  Other Topics Concern   Not on file  Social History Narrative   Not on file   Social Determinants of Health   Financial Resource Strain: Not on file  Food Insecurity: Not on file  Transportation Needs: Not on file  Physical Activity: Not on file  Stress: Not on file  Social Connections: Not on file     Family History: The patient's family history includes Brain cancer in his mother; Heart Problems in his brother; Heart attack (age of onset: 46) in his brother; Heart attack (age of onset: 49) in his father; Parkinson's disease (age of onset: 58) in his brother; Stroke in his mother; Stroke (age of onset: 59) in his sister.  ROS:   ROS Please see the history of present illness.     All other systems reviewed and are negative.  EKGs/Labs/Other Studies Reviewed:    The following studies were reviewed today:   EKG:  EKG is  ordered today.  The ekg ordered today is personally reviewed and demonstrates sinus  rhythm today's EKG is normal  Recent Labs:   Physical Exam:    VS:  BP 122/69 (BP Location: Right Arm, Patient Position: Sitting, Cuff Size: Large)   Pulse 83   Ht _0  (1.727 m)   Wt 218 lb 9.6 oz (99.2 kg)   SpO2 97%   BMI 33.24 kg/m     Wt Readings from Last 3 Encounters:  04/20/21 218 lb 9.6 oz (99.2 kg)  03/17/21 221 lb (100.2 kg)     GEN: He appears his age well nourished, well developed in no acute distress HEENT: Normal NECK: No JVD; No carotid bruits LYMPHATICS: No lymphadenopathy CARDIAC: RRR, no murmurs, rubs, gallops RESPIRATORY:  Clear to auscultation without rales, wheezing or rhonchi he is barrel chested ABDOMEN: Soft, non-tender, non-distended MUSCULOSKELETAL:  No edema; No deformity  SKIN: Warm and dry NEUROLOGIC:  Alert and oriented x 3 PSYCHIATRIC:  Normal affect     Signed, Jermaine More, MD  04/20/2021 4:47 PM    [facility]

## 2021-04-20 ENCOUNTER — Other Ambulatory Visit: Payer: Self-pay

## 2021-04-20 ENCOUNTER — Ambulatory Visit: Payer: HMO | Admitting: Cardiology

## 2021-04-20 ENCOUNTER — Encounter: Payer: Self-pay | Admitting: Cardiology

## 2021-04-20 VITALS — BP 122/69 | HR 83 | Ht 68.0 in | Wt 218.6 lb

## 2021-04-20 DIAGNOSIS — I6521 Occlusion and stenosis of right carotid artery: Secondary | ICD-10-CM

## 2021-04-20 DIAGNOSIS — E1140 Type 2 diabetes mellitus with diabetic neuropathy, unspecified: Secondary | ICD-10-CM | POA: Diagnosis not present

## 2021-04-20 DIAGNOSIS — I10 Essential (primary) hypertension: Secondary | ICD-10-CM

## 2021-04-20 DIAGNOSIS — E785 Hyperlipidemia, unspecified: Secondary | ICD-10-CM

## 2021-04-20 DIAGNOSIS — M109 Gout, unspecified: Secondary | ICD-10-CM | POA: Diagnosis not present

## 2021-04-20 DIAGNOSIS — E1169 Type 2 diabetes mellitus with other specified complication: Secondary | ICD-10-CM | POA: Diagnosis not present

## 2021-04-20 DIAGNOSIS — Z01810 Encounter for preprocedural cardiovascular examination: Secondary | ICD-10-CM

## 2021-04-20 MED ORDER — ROSUVASTATIN CALCIUM 10 MG PO TABS: 10.0000 mg | ORAL_TABLET | Freq: Every day | ORAL | 3 refills | Status: DC

## 2021-04-20 NOTE — Patient Instructions (Signed)

## 2021-04-21 ENCOUNTER — Ambulatory Visit (HOSPITAL_BASED_OUTPATIENT_CLINIC_OR_DEPARTMENT_OTHER)
Admission: RE | Admit: 2021-04-21 | Discharge: 2021-04-21 | Disposition: A | Discharge status: Home / Self Care | Payer: HMO | Source: Ambulatory Visit | Attending: Cardiology | Admitting: Cardiology

## 2021-04-21 ENCOUNTER — Encounter: Payer: Self-pay | Admitting: Vascular Surgery

## 2021-04-21 ENCOUNTER — Ambulatory Visit (INDEPENDENT_AMBULATORY_CARE_PROVIDER_SITE_OTHER): Payer: HMO | Admitting: Vascular Surgery

## 2021-04-21 DIAGNOSIS — I10 Essential (primary) hypertension: Secondary | ICD-10-CM | POA: Diagnosis not present

## 2021-04-21 DIAGNOSIS — I6521 Occlusion and stenosis of right carotid artery: Secondary | ICD-10-CM | POA: Diagnosis not present

## 2021-04-21 DIAGNOSIS — E785 Hyperlipidemia, unspecified: Secondary | ICD-10-CM | POA: Insufficient documentation

## 2021-04-21 DIAGNOSIS — Z01810 Encounter for preprocedural cardiovascular examination: Secondary | ICD-10-CM | POA: Insufficient documentation

## 2021-04-21 LAB — ECHOCARDIOGRAM COMPLETE
AR max vel: 4.01 cm2
AV Area VTI: 3.76 cm2
AV Area mean vel: 3.75 cm2
AV Mean grad: 8 mmHg
AV Peak grad: 13.8 mmHg
Ao pk vel: 1.86 m/s
Area-P 1/2: 3.27 cm2
Calc EF: 58.2 %
S' Lateral: 2.4 cm
Single Plane A2C EF: 48.6 %
Single Plane A4C EF: 66.3 %

## 2021-04-21 NOTE — Progress Notes (Signed)
Virtual Visit via Telephone Note  Referring MD: Dr. Venetia Maxon  I connected with Vevelyn Royals on 04/21/2021 using the [email or web address] by telephone and verified that I was speaking with the correct person using two identifiers. Patient was located at home and accompanied by his daughter. I am located at the office.   The limitations of evaluation and management by telemedicine and the availability of in person appointments have been previously discussed with the patient and are documented in the patients chart. The patient expressed understanding and consented to proceed.  PCP: Street, Sharon Mt, MD   Chief Complaint: Carotid artery stenosis  History of Present Illness: Jermaine Hester is a 87 y.o. male with history of right carotid artery stenosis which is asymptomatic.  He is on aspirin Plavix.  He has seen Dr. Bettina Gavia and echo was performed earlier today but he does not know the results.  Continues to deny any history of stroke, TIA or amaurosis.  He is a former smoker  Past Medical History:  Diagnosis Date   Arthropathy of right shoulder    Benign essential hypertension    BPH with obstruction/lower urinary tract symptoms    Diabetic neuropathy (HCC)    Dyslipidemia    Dyslipidemia    History of fungal skin infection 2021   Significant over joint   ICAO (internal carotid artery occlusion), bilateral    Noted on recent CTA scans   Idiopathic chronic gout of multiple sites without tophus    Morbid obesity ([place])    Primary localized osteoarthrosis of multiple sites    Psoriasis    Scar tissue 2021   Statin myopathy    Type 2 diabetes mellitus with hyperlipidemia [facility])     Past Surgical History:  Procedure Laterality Date   MULTIPLE ORTHOPEDIC PROCEDURES     ORIF FEMUR FRACTURE Right 01/29/2016   S/P FALL.  DR. Lovena Neighbours, [facility].   REPLACEMENT TOTAL KNEE     STATUS POST SHOULDER REPLACEMENT     STATUS POST SHOULDER REPLACEMENT AND REVISION ROTATOR CUFF SURGERY     TOTAL HIP  ARTHROPLASTY      Current Meds  Medication Sig   acetaminophen (TYLENOL) 500 MG tablet Take 500 mg by mouth every 6 (six) hours as needed for mild pain.   allopurinol (ZYLOPRIM) 100 MG tablet Take 100 mg by mouth 2 (two) times daily.   amLODipine (NORVASC) 10 MG tablet Take 10 mg by mouth daily.   ascorbic acid (VITAMIN C) 1000 MG tablet Take by mouth.   aspirin 325 MG tablet Take 325 mg by mouth daily.   B Complex Vitamins (VITAMIN B COMPLEX) TABS Take 1 tablet by mouth daily.   Blood Glucose Monitoring Suppl (ONE TOUCH ULTRA 2) w/Device KIT See admin instructions.   Calcium Carbonate-Vitamin D 600-125 MG-UNIT TABS Take by mouth.   Cholecalciferol 25 MCG (1000 UT) tablet Take by mouth.   Coenzyme Q10 200 MG capsule Take 1 tablet by mouth every morning.   enalapril (VASOTEC) 10 MG tablet Take 10 mg by mouth daily.   Gabapentin, Once-Daily, 300 MG TABS Take by mouth.   Lancets (ONETOUCH DELICA PLUS [id number]) MISC USE 1 TO CHECK GLUCOSE ONCE DAILY   Magnesium Oxide 420 MG TABS Take 1 tablet by mouth every morning.   metFORMIN (GLUCOPHAGE) 500 MG tablet Take 500 mg by mouth. 1000 mg every am and 500 mg evening   Omega-3 Fatty Acids (OMEGA-3 2100) 1050 MG CAPS Take 1 capsule by mouth daily.  ONETOUCH ULTRA test strip USE 1 STRIP TO CHECK GLUCOSE ONCE DAILY   pioglitazone (ACTOS) 30 MG tablet Take by mouth.   rosuvastatin (CRESTOR) 10 MG tablet Take 1 tablet (10 mg total) by mouth daily.   tamsulosin (FLOMAX) 0.4 MG CAPS capsule Take by mouth.    12 system ROS was negative unless otherwise noted in HPI   Observations/Objective: There were no vitals filed for this visit. Patient demonstrates good understanding of our conversation on the phone today with his daughter on the phone as well  Assessment and Plan: 87 year old male with high-grade right ICA stenosis that is asymptomatic.  We have again discussed the options to be medical therapy versus carotid endarterectomy as I do not think  with his level of calcification that he is a candidate for stenting.  He demonstrates good understanding and is elected for carotid endarterectomy.  He has seen Dr. Bettina Gavia had an echo this morning.  We will get the okay from Dr. Bettina Gavia and plan right carotid endarterectomy in the near future.    I spent 15 minutes with the patient via telephone encounter and in reviewing documentation.   Signed, Servando Snare [facility] Office: [phone]  04/21/2021, 11:29 AM

## 2021-04-25 ENCOUNTER — Telehealth: Payer: Self-pay

## 2021-04-25 NOTE — Telephone Encounter (Signed)
-----   Message from Baldo Daub, MD sent at 04/25/2021  7:48 AM EDT ----- This is a good result cardiac function is normal proceed with his planned vascular surgery no findings of previous heart attack normal ejection fraction.

## 2021-04-25 NOTE — Telephone Encounter (Signed)
Spoke with patient regarding results and recommendation.  Patient verbalizes understanding and is agreeable to plan of care. Advised patient to call back with any issues or concerns.  

## 2021-04-27 ENCOUNTER — Other Ambulatory Visit: Payer: Self-pay

## 2021-05-12 NOTE — Progress Notes (Signed)
Surgical Instructions    Your procedure is scheduled on May 24, 2021.  Report to [facility] Main Entrance "A" at 06:30 A.M., then check in with the Admitting office.  Call this number if you have problems the morning of surgery:  [phone]   If you have any questions prior to your surgery date call [phone]: Open Monday-Friday 8am-4pm    Remember:  Do not eat or drink after midnight the night before your surgery   Take these medicines the morning of surgery with A SIP OF WATER : Allopurinol (Zyloprim) Amlodipine (Norvasc) Aspirin Rosuvastatin (Crestor) Tamsulosin (Flomax)  If needed: Acetaminophen (Tylenol)  As of today, STOP taking any Aleve, Naproxen, Ibuprofen, Motrin, Advil, Goody's, BC's, all herbal medications, fish oil, and all vitamins.  WHAT DO I DO ABOUT MY DIABETES MEDICATION?   Do not take oral diabetes medicines (pills) the morning of surgery. DO NOT TAKE METFORMIN (GLUCOPHAGE) OR PIOGLITAZONE (ACTOS) THE MORNING OF SURGERY   HOW TO MANAGE YOUR DIABETES BEFORE AND AFTER SURGERY  Why is it important to control my blood sugar before and after surgery? Improving blood sugar levels before and after surgery helps healing and can limit problems. A way of improving blood sugar control is eating a healthy diet by:  Eating less sugar and carbohydrates  Increasing activity/exercise  Talking with your doctor about reaching your blood sugar goals High blood sugars (greater than 180 mg/dL) can raise your risk of infections and slow your recovery, so you will need to focus on controlling your diabetes during the weeks before surgery. Make sure that the doctor who takes care of your diabetes knows about your planned surgery including the date and location.  How do I manage my blood sugar before surgery? Check your blood sugar at least 4 times a day, starting 2 days before surgery, to make sure that the level is not too high or low.  Check your blood sugar the  morning of your surgery when you wake up and every 2 hours until you get to the Short Stay unit.  If your blood sugar is less than 70 mg/dL, you will need to treat for low blood sugar: Do not take insulin. Treat a low blood sugar (less than 70 mg/dL) with  cup of clear juice (cranberry or apple), 4 glucose tablets, OR glucose gel. Recheck blood sugar in 15 minutes after treatment (to make sure it is greater than 70 mg/dL). If your blood sugar is not greater than 70 mg/dL on recheck, call [phone] for further instructions. Report your blood sugar to the short stay nurse when you get to Short Stay.  If you are admitted to the hospital after surgery: Your blood sugar will be checked by the staff and you will probably be given insulin after surgery (instead of oral diabetes medicines) to make sure you have good blood sugar levels. The goal for blood sugar control after surgery is 80-180 mg/dL.                    Do not wear jewelry Do not wear lotions, powders, colognes, or deodorant. Do not shave 48 hours prior to surgery.  Men may shave face and neck. Do not bring valuables to the hospital.   [facility] is not responsible for any belongings or valuables.  Do NOT Smoke (Tobacco/Vaping) or drink Alcohol 24 hours prior to your procedure If you use a CPAP at night, you may bring all equipment for your overnight stay.   Contacts,  glasses, dentures or bridgework may not be worn into surgery, please bring cases for these belongings   For patients admitted to the hospital, discharge time will be determined by your treatment team.   Patients discharged the day of surgery will not be allowed to drive home, and someone needs to stay with them for 24 hours.  ONLY 1 SUPPORT PERSON MAY BE PRESENT WHILE YOU ARE IN SURGERY. IF YOU ARE TO BE ADMITTED ONCE YOU ARE IN YOUR ROOM YOU WILL BE ALLOWED TWO (2) VISITORS.  Minor children may have two parents present. Special consideration for safety and  communication needs will be reviewed on a case by case basis.  Special instructions:   [place]- Preparing For Surgery  Before surgery, you can play an important role. Because skin is not sterile, your skin needs to be as free of germs as possible. You can reduce the number of germs on your skin by washing with CHG (chlorahexidine gluconate) Soap before surgery.  CHG is an antiseptic cleaner which kills germs and bonds with the skin to continue killing germs even after washing.    Oral Hygiene is also important to reduce your risk of infection.  Remember - BRUSH YOUR TEETH THE MORNING OF SURGERY WITH YOUR REGULAR TOOTHPASTE  Please do not use if you have an allergy to CHG or antibacterial soaps. If your skin becomes reddened/irritated stop using the CHG.  Do not shave (including legs and underarms) for at least 48 hours prior to first CHG shower. It is OK to shave your face.  Please follow these instructions carefully.   Shower the NIGHT BEFORE SURGERY and the MORNING OF SURGERY  If you chose to wash your hair, wash your hair first as usual with your normal shampoo.  After you shampoo, rinse your hair and body thoroughly to remove the shampoo.  Wash Face and genitals (private parts) with your normal soap.   Use CHG Soap as you would any other liquid soap. You can apply CHG directly to the skin and wash gently with a scrungie or a clean washcloth.   Apply the CHG Soap to your body ONLY FROM THE NECK DOWN.  Do not use on open wounds or open sores. Avoid contact with your eyes, ears, mouth and genitals (private parts).   Wash thoroughly, paying special attention to the area where your surgery will be performed.  Thoroughly rinse your body with warm water from the neck down.  DO NOT shower/wash with your normal soap after using and rinsing off the CHG Soap.  Pat yourself dry with a CLEAN TOWEL.  Wear CLEAN PAJAMAS to bed the night before surgery  Place CLEAN SHEETS on your bed the  night before your surgery  DO NOT SLEEP WITH PETS.   Day of Surgery: Take a shower with CHG soap as directed above. Wear Clean/Comfortable clothing the morning of surgery Do not apply any deodorants/lotions.   Remember to brush your teeth WITH YOUR REGULAR TOOTHPASTE.   Please read over the following fact sheets that you were given.

## 2021-05-15 ENCOUNTER — Encounter (HOSPITAL_COMMUNITY)
Admission: RE | Admit: 2021-05-15 | Discharge: 2021-05-15 | Disposition: A | Discharge status: Home / Self Care | Payer: HMO | Source: Ambulatory Visit | Attending: Vascular Surgery | Admitting: Vascular Surgery

## 2021-05-15 ENCOUNTER — Other Ambulatory Visit: Payer: Self-pay

## 2021-05-15 ENCOUNTER — Encounter (HOSPITAL_COMMUNITY): Payer: Self-pay

## 2021-05-15 DIAGNOSIS — Z01812 Encounter for preprocedural laboratory examination: Secondary | ICD-10-CM | POA: Diagnosis not present

## 2021-05-15 HISTORY — DX: Sleep apnea, unspecified: G47.30

## 2021-05-15 LAB — COMPREHENSIVE METABOLIC PANEL
ALT: 13 U/L (ref 0–44)
AST: 24 U/L (ref 15–41)
Albumin: 3.4 g/dL — ABNORMAL LOW (ref 3.5–5.0)
Alkaline Phosphatase: 53 U/L (ref 38–126)
Anion gap: 11 (ref 5–15)
BUN: 16 mg/dL (ref 8–23)
CO2: 24 mmol/L (ref 22–32)
Calcium: 9.3 mg/dL (ref 8.9–10.3)
Chloride: 101 mmol/L (ref 98–111)
Creatinine, Ser: 0.83 mg/dL (ref 0.61–1.24)
GFR, Estimated: >60 mL/min (ref >60)
Glucose, Bld: 148 mg/dL — ABNORMAL HIGH (ref 70–99)
Potassium: 4.1 mmol/L (ref 3.5–5.1)
Sodium: 136 mmol/L (ref 135–145)
Total Bilirubin: 0.5 mg/dL (ref 0.3–1.2)
Total Protein: 6.7 g/dL (ref 6.5–8.1)

## 2021-05-15 LAB — BLOOD GAS, ARTERIAL
Acid-Base Excess: 2.5 mmol/L — ABNORMAL HIGH (ref 0.0–2.0)
Bicarbonate: 26.7 mmol/L (ref 20.0–28.0)
Drawn by: 602861
FIO2: 21
O2 Saturation: 96.3 %
Patient temperature: 37
pCO2 arterial: 42.6 mmHg (ref 32.0–48.0)
pH, Arterial: 7.414 (ref 7.350–7.450)
pO2, Arterial: 84 mmHg (ref 83.0–108.0)

## 2021-05-15 LAB — TYPE AND SCREEN
ABO/RH(D): O NEG
Antibody Screen: NEGATIVE

## 2021-05-15 LAB — URINALYSIS, ROUTINE W REFLEX MICROSCOPIC
Bilirubin Urine: NEGATIVE
Glucose, UA: NEGATIVE mg/dL
Hgb urine dipstick: NEGATIVE
Ketones, ur: NEGATIVE mg/dL
Leukocytes,Ua: NEGATIVE
Nitrite: NEGATIVE
Protein, ur: NEGATIVE mg/dL
Specific Gravity, Urine: 1.013 (ref 1.005–1.030)
pH: 7 (ref 5.0–8.0)

## 2021-05-15 LAB — CBC
HCT: 34.7 % — ABNORMAL LOW (ref 39.0–52.0)
Hemoglobin: 11.2 g/dL — ABNORMAL LOW (ref 13.0–17.0)
MCH: 31 pg (ref 26.0–34.0)
MCHC: 32.3 g/dL (ref 30.0–36.0)
MCV: 96.1 fL (ref 80.0–100.0)
Platelets: 234 10*3/uL (ref 150–400)
RBC: 3.61 MIL/uL — ABNORMAL LOW (ref 4.22–5.81)
RDW: 15.1 % (ref 11.5–15.5)
WBC: 5.8 10*3/uL (ref 4.0–10.5)
nRBC: 0 % (ref 0.0–0.2)

## 2021-05-15 LAB — GLUCOSE, CAPILLARY: Glucose-Capillary: 164 mg/dL — ABNORMAL HIGH (ref 70–99)

## 2021-05-15 LAB — PROTIME-INR
INR: 1 (ref 0.8–1.2)
Prothrombin Time: 13.1 seconds (ref 11.4–15.2)

## 2021-05-15 LAB — APTT: aPTT: 30 seconds (ref 24–36)

## 2021-05-15 LAB — SURGICAL PCR SCREEN
MRSA, PCR: POSITIVE — AB
Staphylococcus aureus: POSITIVE — AB

## 2021-05-15 NOTE — Progress Notes (Signed)
PCP - Maryjean Ka MD Cardiologist - Norman Herrlich MD  PPM/ICD -denies  Device Orders -  Rep Notified -   Chest x-ray -no EKG -04/20/21 Stress Test - no ECHO - 04/21/21 Cardiac Cath - no   Sleep Study - Pt stated it was done years ago "behind [facility] he doesn't remember the name of the facility or the date of procedure or the doctor. No results found in Care Everywhere or chart review.  CPAP - yes  Fasting Blood Sugar -120's Checks Blood Sugar one time a day  Blood Thinner Instructions:n/a Aspirin Instructions:continue.   ERAS Protcol -no PRE-SURGERY Ensure or G2-   COVID TEST- scheduled for Monday 05/22/21.    Anesthesia review: yes for cardiac history  Patient denies shortness of breath, fever, cough and chest pain at PAT appointment   All instructions explained to the patient, with a verbal understanding of the material. Patient agrees to go over the instructions while at home for a better understanding. Patient also instructed to self quarantine after being tested for COVID-19. The opportunity to ask questions was provided.

## 2021-05-16 ENCOUNTER — Encounter (HOSPITAL_COMMUNITY): Payer: Self-pay

## 2021-05-16 NOTE — Anesthesia Preprocedure Evaluation (Addendum)
Anesthesia Evaluation  Patient identified by MRN, date of birth, ID band Patient awake    Reviewed: Allergy & Precautions, NPO status , Patient's Chart, lab work & pertinent test results  History of Anesthesia Complications (+) PONV  Airway Mallampati: III  TM Distance: >3 FB Neck ROM: Full    Dental  (+) Teeth Intact   Pulmonary sleep apnea and Continuous Positive Airway Pressure Ventilation , former smoker,    Pulmonary exam normal        Cardiovascular hypertension, Pt. on medications  Rhythm:Regular Rate:Normal     Neuro/Psych Carotid stenosis negative psych ROS   GI/Hepatic negative GI ROS, Neg liver ROS,   Endo/Other  diabetes, Type 2  Renal/GU    BPH    Musculoskeletal  (+) Arthritis , Osteoarthritis,    Abdominal (+)  Abdomen: soft. Bowel sounds: normal.  Peds  Hematology  (+) anemia ,   Anesthesia Other Findings   Reproductive/Obstetrics                           Anesthesia Physical Anesthesia Plan  ASA: 3  Anesthesia Plan: General   Post-op Pain Management:    Induction: Intravenous  PONV Risk Score and Plan: 3 and Ondansetron, Dexamethasone and Treatment may vary due to age or medical condition  Airway Management Planned: Mask and Oral ETT  Additional Equipment: Arterial line  Intra-op Plan:   Post-operative Plan: Extubation in OR  Informed Consent: I have reviewed the patients History and Physical, chart, labs and discussed the procedure including the risks, benefits and alternatives for the proposed anesthesia with the patient or authorized representative who has indicated his/her understanding and acceptance.     Dental advisory given  Plan Discussed with: CRNA  Anesthesia Plan Comments: (PAT note written 05/16/2021 by Myra Gianotti, PA-C.  Lab Results      Component                Value               Date                      WBC                       5.8                 05/15/2021                HGB                      11.2 (L)            05/15/2021                HCT                      34.7 (L)            05/15/2021                MCV                      96.1                05/15/2021                PLT  234                 05/15/2021           Lab Results      Component                Value               Date                      NA                       136                 05/15/2021                K                        4.1                 05/15/2021                CO2                      24                  05/15/2021                GLUCOSE                  148 (H)             05/15/2021                BUN                      16                  05/15/2021                CREATININE               0.83                05/15/2021                CALCIUM                  9.3                 05/15/2021                GFRNONAA                 >60                 05/15/2021                GFRAA                                        11/20/2010            >60        The eGFR has been calculated using the MDRD equation. This calculation has not been validated in all clinical situations. eGFR's persistently <60 mL/min signify possible Chronic Kidney Disease.  CTA neck  01/17/21 ([facility]): Impression: 1. Unchanged abnormal appearance of the right vertebral artery at the level of C2 where there is focal tortuosity, luminal irregularity and stenosis. Advanced degenerative changes result in extrinsic compression of the lumen at this level, although as discussed previously, the associated contour irregularity raises concern for a more acute/recent superimposed vessel injury.  2. Similar heavily calcified plaque contributing to greater than 70% stenosis of the proximal right ICA.  3. No new or progressive arterial abnormality in the neck compared to3/24/2022.  ECHO 04/21/21: 1. Left ventricular ejection fraction, by  estimation, is 60 to 65%. The  left ventricle has normal function. The left ventricle has no regional  wall motion abnormalities. Left ventricular diastolic parameters are  consistent with Grade I diastolic  dysfunction (impaired relaxation).  2. Right ventricular systolic function is normal. The right ventricular  size is normal. There is normal pulmonary artery systolic pressure.  3. Left atrial size was moderately dilated.  4. The mitral valve is normal in structure. No evidence of mitral valve  regurgitation. No evidence of mitral stenosis.  5. The aortic valve is normal in structure. Aortic valve regurgitation is  not visualized. No aortic stenosis is present.  6. The inferior vena cava is normal in size with greater than 50%  respiratory variability, suggesting right atrial pressure of 3 mmHg. )      Anesthesia Quick Evaluation

## 2021-05-16 NOTE — Progress Notes (Signed)
Anesthesia Chart Review:  Case: [id number] Date/Time: 05/24/21 0815   Procedure: RIGHT CAROTID ARTERY ENDARTERECTOMY (Right)   Anesthesia type: General   Pre-op diagnosis: Right Carotid Artery Stenosis   Location: [place] [facility] ROOM 12 / [place] [facility]   Surgeons: Waynetta Sandy, MD       DISCUSSION: Patient is an 87 year old male scheduled for the above procedure. He has severe asymptomatic right ICA stenosis.   History includes former smoker, dyslipidemia (with statin myopathy), BPH, OSA (uses CPAP), psoriasis, DM2 (with neuropathy), HTN, carotid artery disease, arthritis (s/p bilateral shoulder replacements; bilateral TKA & THA), back surgery (L3-5 laminectomy 04/12/00; L2-4 redo laminectomy 11/24/10). BMI is consistent with obesity.   He had a preoperative cardiology evaluation by Dr. Bettina Gavia on April 20, 2021. Dr Bettina Gavia wrote, "From a cardiology perspective he has no known heart disease but is short of breath with activity had a previous abnormal EKG suggesting possible infarction and will undergo echocardiogram my office.  My xpectation is it will be eassuring at that point proceed with his planned elective surgery without further cardiology evaluation.  His EKG is normal without any conduction abnormality and I do think he requires ambulatory heart rhythm monitoring at this time.  I do not see an indication for an ischemia evaluation.  Postoperatively I will place in a monitored bed EKG postoperative day 1 continue his usual medications and if there is any problems contact heart care to participate in his hospital course. His echocardiogram shows normal cardiac function no evidence of preceding myocardial infarction Proceed with his planned vascular surgery.  He is for COVID-19 test on 05/22/21. Anesthesia team to evaluate on the day of surgery.   VS: BP (!) 177/68   Pulse 76   Temp 36.5 C (Oral)   Resp 19   Ht 5' 8"  (1.727 m)   Wt 99.8 kg   SpO2 98%   BMI 33.45 kg/m    PROVIDERS: Street,  Sharon Mt, MD is PCP Shirlee More, MD is cardiologist   LABS: Labs reviewed: Acceptable for surgery. A1c 6.9% 01/13/21.  (all labs ordered are listed, but only abnormal results are displayed)  Labs Reviewed  SURGICAL PCR SCREEN - Abnormal; Notable for the following components:      Result Value   MRSA, PCR POSITIVE (*)    Staphylococcus aureus POSITIVE (*)    All other components within normal limits  GLUCOSE, CAPILLARY - Abnormal; Notable for the following components:   Glucose-Capillary 164 (*)    All other components within normal limits  CBC - Abnormal; Notable for the following components:   RBC 3.61 (*)    Hemoglobin 11.2 (*)    HCT 34.7 (*)    All other components within normal limits  COMPREHENSIVE METABOLIC PANEL - Abnormal; Notable for the following components:   Glucose, Bld 148 (*)    Albumin 3.4 (*)    All other components within normal limits  URINALYSIS, ROUTINE W REFLEX MICROSCOPIC - Abnormal; Notable for the following components:   Color, Urine AMBER (*)    APPearance CLOUDY (*)    All other components within normal limits  BLOOD GAS, ARTERIAL - Abnormal; Notable for the following components:   Acid-Base Excess 2.5 (*)    All other components within normal limits  PROTIME-INR  APTT  TYPE AND SCREEN     IMAGES: CT Chest 02/20/21 [facility]): IMPRESSION: Healing left 1st through 7th rib fracture. Tiny left pleural effusion and mild left basilar atelectasis. No evidence of pneumothorax.  Aortic Atherosclerosis.   CTA neck 01/17/21 ([facility]): Impression: 1.  Unchanged abnormal appearance of the right vertebral artery at the level of C2 where there is focal tortuosity, luminal irregularity and stenosis. Advanced degenerative changes result in extrinsic compression of the lumen at this level, although as discussed previously, the associated contour irregularity raises concern for a more acute/recent superimposed vessel injury.  2.  Similar heavily  calcified plaque contributing to greater than 70% stenosis of the proximal right ICA.  3.  No new or progressive arterial abnormality in the neck compared to3/24/2022.   EKG: 04/20/21: SR with first degree AV block (PR 236 ms).   CV: Echo 04/21/21: IMPRESSIONS   1. Left ventricular ejection fraction, by estimation, is 60 to 65%. The  left ventricle has normal function. The left ventricle has no regional  wall motion abnormalities. Left ventricular diastolic parameters are  consistent with Grade I diastolic  dysfunction (impaired relaxation).   2. Right ventricular systolic function is normal. The right ventricular  size is normal. There is normal pulmonary artery systolic pressure.   3. Left atrial size was moderately dilated.   4. The mitral valve is normal in structure. No evidence of mitral valve  regurgitation. No evidence of mitral stenosis.   5. The aortic valve is normal in structure. Aortic valve regurgitation is  not visualized. No aortic stenosis is present.   6. The inferior vena cava is normal in size with greater than 50%  respiratory variability, suggesting right atrial pressure of 3 mmHg.   Past Medical History:  Diagnosis Date   Arthropathy of right shoulder    Benign essential hypertension    BPH with obstruction/lower urinary tract symptoms    Diabetic neuropathy (HCC)    Dyslipidemia    Dyslipidemia    History of fungal skin infection 2021   Significant over joint   ICAO (internal carotid artery occlusion), bilateral    Noted on recent CTA scans   Idiopathic chronic gout of multiple sites without tophus    Morbid obesity ([place])    Primary localized osteoarthrosis of multiple sites    Psoriasis    Scar tissue 2021   Statin myopathy    Type 2 diabetes mellitus with hyperlipidemia [facility])     Past Surgical History:  Procedure Laterality Date   MULTIPLE ORTHOPEDIC PROCEDURES     ORIF FEMUR FRACTURE Right 01/29/2016   S/P FALL.  DR. Lovena Neighbours, [facility].    REPLACEMENT TOTAL KNEE Bilateral    STATUS POST SHOULDER REPLACEMENT Bilateral    STATUS POST SHOULDER REPLACEMENT AND REVISION ROTATOR CUFF SURGERY     TOTAL HIP ARTHROPLASTY Bilateral     MEDICATIONS:  acetaminophen (TYLENOL) 500 MG tablet   allopurinol (ZYLOPRIM) 100 MG tablet   amLODipine (NORVASC) 10 MG tablet   ascorbic acid (VITAMIN C) 1000 MG tablet   aspirin 325 MG tablet   B Complex Vitamins (VITAMIN B COMPLEX) TABS   Blood Glucose Monitoring Suppl (ONE TOUCH ULTRA 2) w/Device KIT   Calcium Carbonate-Vitamin D 600-125 MG-UNIT TABS   Cholecalciferol 25 MCG (1000 UT) tablet   Coenzyme Q10 200 MG capsule   enalapril (VASOTEC) 10 MG tablet   Gabapentin, Once-Daily, 300 MG TABS   Lancets (ONETOUCH DELICA PLUS [id number]) MISC   Magnesium Oxide 420 MG TABS   metFORMIN (GLUCOPHAGE) 500 MG tablet   Omega-3 Fatty Acids (FISH OIL) 500 MG CAPS   ONETOUCH ULTRA test strip   pioglitazone (ACTOS) 30 MG tablet   rosuvastatin (CRESTOR) 10  MG tablet   tamsulosin (FLOMAX) 0.4 MG CAPS capsule   No current facility-administered medications for this encounter.    Myra Gianotti, PA-C Surgical Short Stay/Anesthesiology [facility] Phone [phone] [facility] Phone [phone] 05/16/2021 6:21 PM

## 2021-05-24 ENCOUNTER — Inpatient Hospital Stay (HOSPITAL_COMMUNITY): Payer: HMO | Admitting: Vascular Surgery

## 2021-05-24 ENCOUNTER — Other Ambulatory Visit: Payer: Self-pay

## 2021-05-24 ENCOUNTER — Encounter (HOSPITAL_COMMUNITY): Payer: Self-pay | Admitting: Vascular Surgery

## 2021-05-24 ENCOUNTER — Encounter (HOSPITAL_COMMUNITY)
Admission: RE | Disposition: A | Discharge status: Home / Self Care | Payer: Self-pay | Source: Home / Self Care | Attending: Vascular Surgery

## 2021-05-24 ENCOUNTER — Inpatient Hospital Stay (HOSPITAL_COMMUNITY)
Admission: RE | Admit: 2021-05-24 | Discharge: 2021-05-25 | DRG: 039 | Disposition: A | Discharge status: Home / Self Care | Payer: HMO | Attending: Vascular Surgery | Admitting: Vascular Surgery

## 2021-05-24 ENCOUNTER — Inpatient Hospital Stay (HOSPITAL_COMMUNITY): Payer: HMO | Admitting: Anesthesiology

## 2021-05-24 DIAGNOSIS — E1169 Type 2 diabetes mellitus with other specified complication: Secondary | ICD-10-CM | POA: Diagnosis not present

## 2021-05-24 DIAGNOSIS — Z96659 Presence of unspecified artificial knee joint: Secondary | ICD-10-CM | POA: Diagnosis present

## 2021-05-24 DIAGNOSIS — R112 Nausea with vomiting, unspecified: Secondary | ICD-10-CM | POA: Diagnosis not present

## 2021-05-24 DIAGNOSIS — Z6833 Body mass index (BMI) 33.0-33.9, adult: Secondary | ICD-10-CM

## 2021-05-24 DIAGNOSIS — Z96649 Presence of unspecified artificial hip joint: Secondary | ICD-10-CM | POA: Diagnosis present

## 2021-05-24 DIAGNOSIS — Z79899 Other long term (current) drug therapy: Secondary | ICD-10-CM

## 2021-05-24 DIAGNOSIS — Z7984 Long term (current) use of oral hypoglycemic drugs: Secondary | ICD-10-CM | POA: Diagnosis not present

## 2021-05-24 DIAGNOSIS — R490 Dysphonia: Secondary | ICD-10-CM | POA: Diagnosis not present

## 2021-05-24 DIAGNOSIS — I6521 Occlusion and stenosis of right carotid artery: Secondary | ICD-10-CM | POA: Diagnosis not present

## 2021-05-24 DIAGNOSIS — E785 Hyperlipidemia, unspecified: Secondary | ICD-10-CM | POA: Diagnosis not present

## 2021-05-24 DIAGNOSIS — I10 Essential (primary) hypertension: Secondary | ICD-10-CM | POA: Diagnosis not present

## 2021-05-24 DIAGNOSIS — Z96619 Presence of unspecified artificial shoulder joint: Secondary | ICD-10-CM | POA: Diagnosis present

## 2021-05-24 DIAGNOSIS — Z7982 Long term (current) use of aspirin: Secondary | ICD-10-CM

## 2021-05-24 DIAGNOSIS — Z87891 Personal history of nicotine dependence: Secondary | ICD-10-CM

## 2021-05-24 DIAGNOSIS — E1140 Type 2 diabetes mellitus with diabetic neuropathy, unspecified: Secondary | ICD-10-CM | POA: Diagnosis not present

## 2021-05-24 DIAGNOSIS — G4730 Sleep apnea, unspecified: Secondary | ICD-10-CM | POA: Diagnosis not present

## 2021-05-24 HISTORY — PX: PATCH ANGIOPLASTY: SHX6230

## 2021-05-24 HISTORY — PX: ENDARTERECTOMY: SHX5162

## 2021-05-24 LAB — CBC
HCT: 28.9 % — ABNORMAL LOW (ref 39.0–52.0)
Hemoglobin: 9.4 g/dL — ABNORMAL LOW (ref 13.0–17.0)
MCH: 31.1 pg (ref 26.0–34.0)
MCHC: 32.5 g/dL (ref 30.0–36.0)
MCV: 95.7 fL (ref 80.0–100.0)
Platelets: 193 10*3/uL (ref 150–400)
RBC: 3.02 MIL/uL — ABNORMAL LOW (ref 4.22–5.81)
RDW: 14.9 % (ref 11.5–15.5)
WBC: 7.6 10*3/uL (ref 4.0–10.5)
nRBC: 0 % (ref 0.0–0.2)

## 2021-05-24 LAB — CREATININE, SERUM
Creatinine, Ser: 0.96 mg/dL (ref 0.61–1.24)
GFR, Estimated: >60 mL/min (ref >60)

## 2021-05-24 LAB — GLUCOSE, CAPILLARY
Glucose-Capillary: 144 mg/dL — ABNORMAL HIGH (ref 70–99)
Glucose-Capillary: 157 mg/dL — ABNORMAL HIGH (ref 70–99)

## 2021-05-24 LAB — ABO/RH: ABO/RH(D): O NEG

## 2021-05-24 LAB — POCT ACTIVATED CLOTTING TIME
Activated Clotting Time: 202 seconds
Activated Clotting Time: 237 seconds

## 2021-05-24 SURGERY — ENDARTERECTOMY, CAROTID
Anesthesia: General | Site: Neck | Laterality: Right

## 2021-05-24 MED ORDER — ALLOPURINOL 100 MG PO TABS: 100.0000 mg | ORAL_TABLET | Freq: Two times a day (BID) | ORAL | Status: DC

## 2021-05-24 MED ORDER — ONDANSETRON HCL 4 MG/2ML IJ SOLN
INTRAMUSCULAR | Status: AC
  Filled 2021-05-24: qty 2

## 2021-05-24 MED ORDER — MAGNESIUM OXIDE -MG SUPPLEMENT 400 (240 MG) MG PO TABS
400.0000 mg | ORAL_TABLET | Freq: Every morning | ORAL | Status: DC
  Filled 2021-05-24: qty 1

## 2021-05-24 MED ORDER — METFORMIN HCL 500 MG PO TABS: 1000.0000 mg | ORAL_TABLET | Freq: Every day | ORAL | Status: DC

## 2021-05-24 MED ORDER — CHLORHEXIDINE GLUCONATE 0.12 % MT SOLN: 15.0000 mL | OROMUCOSAL | Status: AC

## 2021-05-24 MED ORDER — ACETAMINOPHEN 10 MG/ML IV SOLN
INTRAVENOUS | Status: AC
  Filled 2021-05-24: qty 100

## 2021-05-24 MED ORDER — GUAIFENESIN-DM 100-10 MG/5ML PO SYRP
15.0000 mL | ORAL_SOLUTION | ORAL | Status: DC | PRN
  Administered 2021-05-24: 15 mL via ORAL
  Filled 2021-05-24: qty 15

## 2021-05-24 MED ORDER — ONDANSETRON HCL 4 MG/2ML IJ SOLN
INTRAMUSCULAR | Status: DC | PRN
  Administered 2021-05-24: 4 mg via INTRAVENOUS

## 2021-05-24 MED ORDER — ROCURONIUM BROMIDE 10 MG/ML (PF) SYRINGE
PREFILLED_SYRINGE | INTRAVENOUS | Status: AC
  Filled 2021-05-24: qty 10

## 2021-05-24 MED ORDER — ACETAMINOPHEN 325 MG PO TABS: 325.0000 mg | ORAL_TABLET | ORAL | Status: DC | PRN

## 2021-05-24 MED ORDER — FENTANYL CITRATE (PF) 100 MCG/2ML IJ SOLN
INTRAMUSCULAR | Status: AC
  Filled 2021-05-24: qty 2

## 2021-05-24 MED ORDER — METFORMIN HCL 500 MG PO TABS
500.0000 mg | ORAL_TABLET | Freq: Every day | ORAL | Status: DC
  Administered 2021-05-24: 500 mg via ORAL
  Filled 2021-05-24: qty 1

## 2021-05-24 MED ORDER — DEXAMETHASONE SODIUM PHOSPHATE 10 MG/ML IJ SOLN
INTRAMUSCULAR | Status: AC
  Filled 2021-05-24: qty 1

## 2021-05-24 MED ORDER — OXYCODONE-ACETAMINOPHEN 5-325 MG PO TABS: 1.0000 | ORAL_TABLET | ORAL | Status: DC | PRN

## 2021-05-24 MED ORDER — PROPOFOL 10 MG/ML IV BOLUS
INTRAVENOUS | Status: DC | PRN
  Administered 2021-05-24: 100 mg via INTRAVENOUS

## 2021-05-24 MED ORDER — LACTATED RINGERS IV SOLN: Freq: Once | INTRAVENOUS | Status: AC

## 2021-05-24 MED ORDER — HEPARIN SODIUM (PORCINE) 1000 UNIT/ML IJ SOLN
INTRAMUSCULAR | Status: AC
  Filled 2021-05-24: qty 1

## 2021-05-24 MED ORDER — AMLODIPINE BESYLATE 10 MG PO TABS
10.0000 mg | ORAL_TABLET | Freq: Every day | ORAL | Status: DC
  Administered 2021-05-25: 10 mg via ORAL
  Filled 2021-05-24: qty 1

## 2021-05-24 MED ORDER — B COMPLEX-C PO TABS
1.0000 | ORAL_TABLET | Freq: Every day | ORAL | Status: DC
  Administered 2021-05-24: 1 via ORAL
  Filled 2021-05-24 (×2): qty 1

## 2021-05-24 MED ORDER — ENALAPRIL MALEATE 5 MG PO TABS
10.0000 mg | ORAL_TABLET | Freq: Every day | ORAL | Status: DC
  Administered 2021-05-24: 10 mg via ORAL
  Filled 2021-05-24 (×2): qty 2

## 2021-05-24 MED ORDER — FENTANYL CITRATE (PF) 100 MCG/2ML IJ SOLN
INTRAMUSCULAR | Status: DC | PRN
  Administered 2021-05-24: 100 ug via INTRAVENOUS
  Administered 2021-05-24: 25 ug via INTRAVENOUS

## 2021-05-24 MED ORDER — VITAMIN B COMPLEX PO TABS: 1.0000 | ORAL_TABLET | Freq: Every day | ORAL | Status: DC

## 2021-05-24 MED ORDER — PROPOFOL 10 MG/ML IV BOLUS
INTRAVENOUS | Status: AC
  Filled 2021-05-24: qty 40

## 2021-05-24 MED ORDER — ACETAMINOPHEN 650 MG RE SUPP: 325.0000 mg | RECTAL | Status: DC | PRN

## 2021-05-24 MED ORDER — HEPARIN SODIUM (PORCINE) 1000 UNIT/ML IJ SOLN
INTRAMUSCULAR | Status: DC | PRN
  Administered 2021-05-24: 5000 [IU] via INTRAVENOUS
  Administered 2021-05-24: 10000 [IU] via INTRAVENOUS

## 2021-05-24 MED ORDER — HEPARIN SODIUM (PORCINE) 5000 UNIT/ML IJ SOLN
5000.0000 [IU] | Freq: Three times a day (TID) | INTRAMUSCULAR | Status: DC
  Administered 2021-05-25 (×2): 5000 [IU] via SUBCUTANEOUS
  Filled 2021-05-24 (×2): qty 1

## 2021-05-24 MED ORDER — ONDANSETRON HCL 4 MG/2ML IJ SOLN
4.0000 mg | Freq: Four times a day (QID) | INTRAMUSCULAR | Status: DC | PRN
  Administered 2021-05-24 – 2021-05-25 (×3): 4 mg via INTRAVENOUS
  Filled 2021-05-24 (×3): qty 2

## 2021-05-24 MED ORDER — ASPIRIN 325 MG PO TABS
325.0000 mg | ORAL_TABLET | Freq: Every day | ORAL | Status: DC
  Administered 2021-05-24 – 2021-05-25 (×2): 325 mg via ORAL
  Filled 2021-05-24 (×2): qty 1

## 2021-05-24 MED ORDER — HEPARIN 6000 UNIT IRRIGATION SOLUTION
Status: AC
  Filled 2021-05-24: qty 500

## 2021-05-24 MED ORDER — PHENOL 1.4 % MT LIQD: 1.0000 | OROMUCOSAL | Status: DC | PRN

## 2021-05-24 MED ORDER — LIDOCAINE HCL (PF) 1 % IJ SOLN
INTRAMUSCULAR | Status: AC
  Filled 2021-05-24: qty 30

## 2021-05-24 MED ORDER — SODIUM CHLORIDE 0.9 % IV SOLN: INTRAVENOUS | Status: DC

## 2021-05-24 MED ORDER — GLYCOPYRROLATE PF 0.2 MG/ML IJ SOSY
PREFILLED_SYRINGE | INTRAMUSCULAR | Status: DC | PRN
  Administered 2021-05-24: .2 mg via INTRAVENOUS

## 2021-05-24 MED ORDER — PROTAMINE SULFATE 10 MG/ML IV SOLN
INTRAVENOUS | Status: DC | PRN
  Administered 2021-05-24: 50 mg via INTRAVENOUS

## 2021-05-24 MED ORDER — LIDOCAINE 2% (20 MG/ML) 5 ML SYRINGE
INTRAMUSCULAR | Status: AC
  Filled 2021-05-24: qty 5

## 2021-05-24 MED ORDER — TAMSULOSIN HCL 0.4 MG PO CAPS
0.4000 mg | ORAL_CAPSULE | Freq: Every day | ORAL | Status: DC
  Administered 2021-05-24: 0.4 mg via ORAL
  Filled 2021-05-24: qty 1

## 2021-05-24 MED ORDER — PHENYLEPHRINE HCL (PRESSORS) 10 MG/ML IV SOLN
INTRAVENOUS | Status: DC | PRN
  Administered 2021-05-24 (×3): 80 ug via INTRAVENOUS

## 2021-05-24 MED ORDER — SODIUM CHLORIDE 0.9 % IV SOLN
INTRAVENOUS | Status: DC | PRN
  Administered 2021-05-24: .1 ug/kg/min via INTRAVENOUS

## 2021-05-24 MED ORDER — LACTATED RINGERS IV SOLN: INTRAVENOUS | Status: DC | PRN

## 2021-05-24 MED ORDER — DOCUSATE SODIUM 100 MG PO CAPS
100.0000 mg | ORAL_CAPSULE | Freq: Every day | ORAL | Status: DC
  Filled 2021-05-24: qty 1

## 2021-05-24 MED ORDER — MORPHINE SULFATE (PF) 2 MG/ML IV SOLN
2.0000 mg | INTRAVENOUS | Status: DC | PRN
  Filled 2021-05-24: qty 1

## 2021-05-24 MED ORDER — ALBUMIN HUMAN 5 % IV SOLN
INTRAVENOUS | Status: DC | PRN
Start: 2021-05-24 — End: 2021-05-24

## 2021-05-24 MED ORDER — METOPROLOL TARTRATE 5 MG/5ML IV SOLN: 2.0000 mg | INTRAVENOUS | Status: DC | PRN

## 2021-05-24 MED ORDER — ACETAMINOPHEN 10 MG/ML IV SOLN
1000.0000 mg | Freq: Once | INTRAVENOUS | Status: DC | PRN
  Administered 2021-05-24: 1000 mg via INTRAVENOUS

## 2021-05-24 MED ORDER — EPHEDRINE SULFATE 50 MG/ML IJ SOLN
INTRAMUSCULAR | Status: DC | PRN
  Administered 2021-05-24 (×2): 10 mg via INTRAVENOUS
  Administered 2021-05-24: 5 mg via INTRAVENOUS

## 2021-05-24 MED ORDER — MAGNESIUM SULFATE 2 GM/50ML IV SOLN: 2.0000 g | Freq: Every day | INTRAVENOUS | Status: DC | PRN

## 2021-05-24 MED ORDER — EPHEDRINE 5 MG/ML INJ
INTRAVENOUS | Status: AC
  Filled 2021-05-24: qty 5

## 2021-05-24 MED ORDER — PHENYLEPHRINE 40 MCG/ML (10ML) SYRINGE FOR IV PUSH (FOR BLOOD PRESSURE SUPPORT)
PREFILLED_SYRINGE | INTRAVENOUS | Status: AC
  Filled 2021-05-24: qty 10

## 2021-05-24 MED ORDER — LABETALOL HCL 5 MG/ML IV SOLN
10.0000 mg | INTRAVENOUS | Status: DC | PRN
Start: 2021-05-24 — End: 2021-05-25

## 2021-05-24 MED ORDER — PROTAMINE SULFATE 10 MG/ML IV SOLN
INTRAVENOUS | Status: AC
  Filled 2021-05-24: qty 5

## 2021-05-24 MED ORDER — VASOPRESSIN 20 UNIT/ML IV SOLN
INTRAVENOUS | Status: AC
  Filled 2021-05-24: qty 1

## 2021-05-24 MED ORDER — PANTOPRAZOLE SODIUM 40 MG PO TBEC
40.0000 mg | DELAYED_RELEASE_TABLET | Freq: Every day | ORAL | Status: DC
  Administered 2021-05-24: 40 mg via ORAL
  Filled 2021-05-24 (×2): qty 1

## 2021-05-24 MED ORDER — VASOPRESSIN 20 UNIT/ML IV SOLN
INTRAVENOUS | Status: DC | PRN
  Administered 2021-05-24 (×3): 1 [IU] via INTRAVENOUS
  Administered 2021-05-24: 2 [IU] via INTRAVENOUS

## 2021-05-24 MED ORDER — GABAPENTIN (ONCE-DAILY) 300 MG PO TABS: 300.0000 mg | ORAL_TABLET | Freq: Every day | ORAL | Status: DC

## 2021-05-24 MED ORDER — POLYETHYLENE GLYCOL 3350 17 G PO PACK: 17.0000 g | PACK | Freq: Every day | ORAL | Status: DC | PRN

## 2021-05-24 MED ORDER — HYDRALAZINE HCL 20 MG/ML IJ SOLN
5.0000 mg | INTRAMUSCULAR | Status: DC | PRN
Start: 2021-05-24 — End: 2021-05-25

## 2021-05-24 MED ORDER — POTASSIUM CHLORIDE CRYS ER 20 MEQ PO TBCR
20.0000 meq | EXTENDED_RELEASE_TABLET | Freq: Every day | ORAL | Status: DC | PRN
Start: 2021-05-24 — End: 2021-05-25

## 2021-05-24 MED ORDER — SODIUM CHLORIDE 0.9 % IV SOLN
0.0125 ug/kg/min | INTRAVENOUS | Status: DC
  Filled 2021-05-24: qty 2000

## 2021-05-24 MED ORDER — ONDANSETRON HCL 4 MG/2ML IJ SOLN: 4.0000 mg | Freq: Once | INTRAMUSCULAR | Status: DC | PRN

## 2021-05-24 MED ORDER — FENTANYL CITRATE (PF) 100 MCG/2ML IJ SOLN
25.0000 ug | INTRAMUSCULAR | Status: DC | PRN
  Administered 2021-05-24: 50 ug via INTRAVENOUS
  Administered 2021-05-24 (×2): 25 ug via INTRAVENOUS

## 2021-05-24 MED ORDER — SODIUM CHLORIDE 0.9 % IV SOLN: 500.0000 mL | Freq: Once | INTRAVENOUS | Status: DC | PRN

## 2021-05-24 MED ORDER — CHLORHEXIDINE GLUCONATE CLOTH 2 % EX PADS: 6.0000 | MEDICATED_PAD | Freq: Once | CUTANEOUS | Status: DC

## 2021-05-24 MED ORDER — 0.9 % SODIUM CHLORIDE (POUR BTL) OPTIME
TOPICAL | Status: DC | PRN
  Administered 2021-05-24: 2000 mL

## 2021-05-24 MED ORDER — ROCURONIUM BROMIDE 100 MG/10ML IV SOLN
INTRAVENOUS | Status: DC | PRN
  Administered 2021-05-24: 60 mg via INTRAVENOUS
  Administered 2021-05-24: 10 mg via INTRAVENOUS

## 2021-05-24 MED ORDER — CALCIUM CARBONATE-VITAMIN D 500-200 MG-UNIT PO TABS
1.0000 | ORAL_TABLET | Freq: Every day | ORAL | Status: DC
  Administered 2021-05-24: 1 via ORAL
  Filled 2021-05-24 (×2): qty 1

## 2021-05-24 MED ORDER — BISACODYL 5 MG PO TBEC: 5.0000 mg | DELAYED_RELEASE_TABLET | Freq: Every day | ORAL | Status: DC | PRN

## 2021-05-24 MED ORDER — CHLORHEXIDINE GLUCONATE 0.12 % MT SOLN
OROMUCOSAL | Status: AC
  Administered 2021-05-24: 15 mL via OROMUCOSAL
  Filled 2021-05-24: qty 15

## 2021-05-24 MED ORDER — PIOGLITAZONE HCL 30 MG PO TABS
30.0000 mg | ORAL_TABLET | Freq: Every day | ORAL | Status: DC
  Administered 2021-05-24: 30 mg via ORAL
  Filled 2021-05-24 (×2): qty 1

## 2021-05-24 MED ORDER — ALUM & MAG HYDROXIDE-SIMETH 200-200-20 MG/5ML PO SUSP
15.0000 mL | ORAL | Status: DC | PRN
  Administered 2021-05-24: 30 mL via ORAL
  Filled 2021-05-24: qty 30

## 2021-05-24 MED ORDER — HEMOSTATIC AGENTS (NO CHARGE) OPTIME
TOPICAL | Status: DC | PRN
  Administered 2021-05-24: 1 via TOPICAL

## 2021-05-24 MED ORDER — CHLORHEXIDINE GLUCONATE CLOTH 2 % EX PADS
6.0000 | MEDICATED_PAD | Freq: Every day | CUTANEOUS | Status: DC
  Administered 2021-05-25: 6 via TOPICAL

## 2021-05-24 MED ORDER — ROSUVASTATIN CALCIUM 5 MG PO TABS
10.0000 mg | ORAL_TABLET | Freq: Every day | ORAL | Status: DC
  Filled 2021-05-24: qty 2

## 2021-05-24 MED ORDER — LIDOCAINE HCL (CARDIAC) PF 100 MG/5ML IV SOSY
PREFILLED_SYRINGE | INTRAVENOUS | Status: DC | PRN
  Administered 2021-05-24: 80 mg via INTRAVENOUS

## 2021-05-24 MED ORDER — COENZYME Q10 200 MG PO CAPS: 200.0000 mg | ORAL_CAPSULE | Freq: Every morning | ORAL | Status: DC

## 2021-05-24 MED ORDER — HEPARIN 6000 UNIT IRRIGATION SOLUTION
Status: DC | PRN
  Administered 2021-05-24: 1

## 2021-05-24 MED ORDER — FENTANYL CITRATE (PF) 250 MCG/5ML IJ SOLN
INTRAMUSCULAR | Status: AC
  Filled 2021-05-24: qty 5

## 2021-05-24 MED ORDER — PHENYLEPHRINE HCL-NACL 20-0.9 MG/250ML-% IV SOLN
INTRAVENOUS | Status: DC | PRN
  Administered 2021-05-24: 50 ug/min via INTRAVENOUS

## 2021-05-24 MED ORDER — LACTATED RINGERS IV SOLN: INTRAVENOUS | Status: DC

## 2021-05-24 MED ORDER — VANCOMYCIN HCL 1500 MG/300ML IV SOLN
1500.0000 mg | INTRAVENOUS | Status: AC
  Administered 2021-05-24: 1500 mg via INTRAVENOUS
  Filled 2021-05-24: qty 300

## 2021-05-24 MED ORDER — GABAPENTIN 100 MG PO CAPS
100.0000 mg | ORAL_CAPSULE | Freq: Three times a day (TID) | ORAL | Status: DC
  Administered 2021-05-24: 100 mg via ORAL
  Filled 2021-05-24 (×2): qty 1

## 2021-05-24 MED ORDER — SUGAMMADEX SODIUM 200 MG/2ML IV SOLN
INTRAVENOUS | Status: DC | PRN
  Administered 2021-05-24: 200 mg via INTRAVENOUS

## 2021-05-24 SURGICAL SUPPLY — 43 items
BAG COUNTER SPONGE SURGICOUNT (BAG) ×2 IMPLANT
CANISTER SUCT 3000ML PPV (MISCELLANEOUS) ×2 IMPLANT
CANNULA VESSEL 3MM 2 BLNT TIP (CANNULA) ×2 IMPLANT
CATH ROBINSON RED A/P 18FR (CATHETERS) ×2 IMPLANT
CLIP LIGATING EXTRA MED SLVR (CLIP) ×2 IMPLANT
CLIP LIGATING EXTRA SM BLUE (MISCELLANEOUS) ×2 IMPLANT
CLIP VESOCCLUDE SM WIDE 24/CT (CLIP) IMPLANT
COVER PROBE W GEL 5X96 (DRAPES) ×2 IMPLANT
DERMABOND ADVANCED (GAUZE/BANDAGES/DRESSINGS) ×1
DERMABOND ADVANCED .7 DNX12 (GAUZE/BANDAGES/DRESSINGS) ×1 IMPLANT
ELECT REM PT RETURN 9FT ADLT (ELECTROSURGICAL) ×2
ELECTRODE REM PT RTRN 9FT ADLT (ELECTROSURGICAL) ×1 IMPLANT
GLOVE SURG ENC MOIS LTX SZ7.5 (GLOVE) ×4 IMPLANT
GOWN STRL REUS W/ TWL LRG LVL3 (GOWN DISPOSABLE) ×2 IMPLANT
GOWN STRL REUS W/ TWL XL LVL3 (GOWN DISPOSABLE) ×3 IMPLANT
GOWN STRL REUS W/TWL LRG LVL3 (GOWN DISPOSABLE) ×4
GOWN STRL REUS W/TWL XL LVL3 (GOWN DISPOSABLE) ×6
HEMOSTAT SNOW SURGICEL 2X4 (HEMOSTASIS) IMPLANT
INSERT FOGARTY SM (MISCELLANEOUS) IMPLANT
KIT BASIN OR (CUSTOM PROCEDURE TRAY) ×2 IMPLANT
KIT SHUNT ARGYLE CAROTID ART 6 (VASCULAR PRODUCTS) ×2 IMPLANT
KIT TURNOVER KIT B (KITS) ×2 IMPLANT
NEEDLE HYPO 25GX1X1/2 BEV (NEEDLE) IMPLANT
NEEDLE SPNL 20GX3.5 QUINCKE YW (NEEDLE) IMPLANT
NS IRRIG 1000ML POUR BTL (IV SOLUTION) ×6 IMPLANT
PACK CAROTID (CUSTOM PROCEDURE TRAY) ×2 IMPLANT
PAD ARMBOARD 7.5X6 YLW CONV (MISCELLANEOUS) ×4 IMPLANT
PATCH VASC XENOSURE 1CMX6CM (Vascular Products) ×2 IMPLANT
PATCH VASC XENOSURE 1X6 (Vascular Products) ×1 IMPLANT
POSITIONER HEAD DONUT 9IN (MISCELLANEOUS) ×2 IMPLANT
POWDER SURGICEL 3.0 GRAM (HEMOSTASIS) ×2 IMPLANT
SUT MNCRL AB 4-0 PS2 18 (SUTURE) ×2 IMPLANT
SUT PROLENE 6 0 BV (SUTURE) ×6 IMPLANT
SUT SILK 3 0 (SUTURE)
SUT SILK 3-0 18XBRD TIE 12 (SUTURE) IMPLANT
SUT VIC AB 2-0 CT1 27 (SUTURE) ×1
SUT VIC AB 2-0 CT1 TAPERPNT 27 (SUTURE) ×1 IMPLANT
SUT VIC AB 3-0 SH 27 (SUTURE) ×2
SUT VIC AB 3-0 SH 27X BRD (SUTURE) ×1 IMPLANT
SYR CONTROL 10ML LL (SYRINGE) IMPLANT
TOWEL GREEN STERILE (TOWEL DISPOSABLE) ×2 IMPLANT
TUBING ART PRESS 48 MALE/FEM (TUBING) IMPLANT
WATER STERILE IRR 1000ML POUR (IV SOLUTION) ×2 IMPLANT

## 2021-05-24 NOTE — Op Note (Signed)
Patient name: Jermaine Hester MRN: [id number] DOB: 1929/11/21 Sex: male  05/24/2021 Pre-operative Diagnosis: Asymptomatic right ICA stenosis Post-operative diagnosis:  Same Surgeon:  Apolinar Junes C. Randie Heinz, MD Assistants: Wendi Maya, PA; Gwynn Burly, MS 3 Procedure Performed: Right carotid endarterectomy with bovine pericardial patch angioplasty  Indications: 87 year old male with history of asymptomatic right ICA stenosis.  It is heavily calcified.  We have discussed medical therapy versus carotid endarterectomy.  Patient does have expected life span greater than 3 years with decided to proceed with right carotid endarterectomy.  An assistant was necessary to expedite the case.  Findings: The common carotid artery itself was very deep.  We did divide multiple branches of facial veins for exposure.  The hypoglossal nerve was identified and protected.  The vagus nerve was also identified.  As the artery was very deep I elected not to shunt.  There was very strong backbleeding from the ICA distally.  After patch angioplasty and endarterectomy of the common carotid artery extending into the internal carotid artery there were expected waveforms that were low resistance in the distal ICA with somewhat high resistant waveforms in the ECA.  Upon awakening from anesthesia patient was noted to be neurologically intact.   Procedure:  The patient was identified in the holding area and taken to the OR where is placed supine operative when general anesthesia was induced.  We had a difficult time positioning the patient given his neck rigidity and size.  Ultimately we were in a satisfactory position he was sterilely prepped and draped in the neck and chest in usual fashion, antibiotics were ministered a timeout was called.  A longitudinal incision was made along the anterior border the sternocleidomastoid and turned inferiorly to the angle of the mandible 1 cm below.  We dissected down through the skin subcutaneous  tissue and platysma.  We identified the sternocleidomastoid this was retracted laterally.  We dissected inferiorly given the depth of this incision we identified the omohyoid muscle this was divided.  Below this we found the common carotid artery and placed an umbilical tape around this and the patient was heparinized fully.  We did have to redose heparin ultimately ACT achieved approximately 230 seconds.  We then dissected to the bifurcation of the common carotid artery.  We did divide multiple branches of the facial vein.  We identified her hypoglossal nerve this was protected.  We did have to divide posterior branch of the external carotid artery for better exposure.  We able to get a vessel loop around the distal ICA where it was normal.  We also placed a vessel loop around the superior thyroidal branch and the trunk of the external carotid artery.  We then prepared a 10 [place] shunt.  We confirmed ACT and her pressure was up to 100 mmHg mean.  We then clamped the ICA followed by the common carotid followed by the external carotid arteries.  We opened the vessel longitudinally.  I confirmed very strong backbleeding from the ICA elected not to shunt.  We proceeded with endarterectomy including eversion of the external.  Distally we had good tapering.  We had fully performed our endarterectomy we thoroughly irrigated with heparinized saline.  We then prepared a bovine pericardial patch and sewed this in place with 6-0 Prolene suture.  Prior completion we allowed flushing all directions.  We then thoroughly irrigated with heparinized saline.  We released our ECA clamp followed by ICA clamp after several cardiac cycles are ICA clamp.  We did  have to place several repair sutures.  We then checked with Doppler we had expected waveforms in the distal ICA with somewhat high resistance in the ECA.  We then administered 50 mg of protamine and obtain hemostasis.  We thoroughly irrigated the wound.  We closed in layers of  Vicryl and Monocryl.  He was awakened from anesthesia having tolerated procedure well.  He was noted to be neurologically intact and transferred to the recovery area in stable condition.  All counts were correct at completion.    EBL: 50cc   Aaban Griep C. Randie Heinz, MD [facility] Office: [phone] Pager: [phone]

## 2021-05-24 NOTE — Transfer of Care (Signed)
Immediate Anesthesia Transfer of Care Note  Patient: Jermaine Hester  Procedure(s) Performed: RIGHT CAROTID ARTERY ENDARTERECTOMY (Right: Neck) PATCH ANGIOPLASTY (Right: Neck)  Patient Location: PACU  Anesthesia Type:General  Level of Consciousness: sedated and patient cooperative  Airway & Oxygen Therapy: Patient Spontanous Breathing and Patient connected to face mask oxygen  Post-op Assessment: Report given to RN, Post -op Vital signs reviewed and stable and Patient moving all extremities  Post vital signs: Reviewed and stable  Last Vitals:  Vitals Value Taken Time  BP    Temp    Pulse 73 05/24/21 1102  Resp 15 05/24/21 1102  SpO2 93 % 05/24/21 1102  Vitals shown include unvalidated device data.  Last Pain:  Vitals:   05/24/21 0651  TempSrc: Oral  PainSc: 0-No pain      Patients Stated Pain Goal: 0 (05/24/21 7482)  Complications: No notable events documented.

## 2021-05-24 NOTE — Anesthesia Postprocedure Evaluation (Signed)
Anesthesia Post Note  Patient: Jermaine Hester  Procedure(s) Performed: RIGHT CAROTID ARTERY ENDARTERECTOMY (Right: Neck) PATCH ANGIOPLASTY (Right: Neck)     Patient location during evaluation: PACU Anesthesia Type: General Level of consciousness: awake and alert Pain management: pain level controlled Vital Signs Assessment: post-procedure vital signs reviewed and stable Respiratory status: spontaneous breathing, nonlabored ventilation, respiratory function stable and patient connected to nasal cannula oxygen Cardiovascular status: blood pressure returned to baseline and stable Postop Assessment: no apparent nausea or vomiting Anesthetic complications: no   No notable events documented.  Last Vitals:  Vitals:   05/24/21 1433 05/24/21 1445  BP: 126/60 (!) 120/56  Pulse: 69 66  Resp: 17 18  Temp: 36.4 C   SpO2: 97% 95%    Last Pain:  Vitals:   05/24/21 1433  TempSrc: Oral  PainSc:                  Jermaine Hester

## 2021-05-24 NOTE — Progress Notes (Signed)
Pt arrived to 4e from PACU. Pt oriented to room and staff. Vitals obtained and stable. NIH completed. Right neck incision with soft swelling and bruising present. Swelling marked. Wife at bedside.

## 2021-05-24 NOTE — H&P (Signed)
History of Present Illness: Jermaine Hester is a 87 y.o. male with history of right carotid artery stenosis which is asymptomatic.  He is on aspirin Plavix.  He has seen Dr. Bettina Gavia and echo was performed.  Continues to deny any history of stroke, TIA or amaurosis.  He is a former smoker       Past Medical History:  Diagnosis Date   Arthropathy of right shoulder     Benign essential hypertension     BPH with obstruction/lower urinary tract symptoms     Diabetic neuropathy (HCC)     Dyslipidemia     Dyslipidemia     History of fungal skin infection 2021    Significant over joint   ICAO (internal carotid artery occlusion), bilateral      Noted on recent CTA scans   Idiopathic chronic gout of multiple sites without tophus     Morbid obesity ([place])     Primary localized osteoarthrosis of multiple sites     Psoriasis     Scar tissue 2021   Statin myopathy     Type 2 diabetes mellitus with hyperlipidemia [facility])             Past Surgical History:  Procedure Laterality Date   MULTIPLE ORTHOPEDIC PROCEDURES       ORIF FEMUR FRACTURE Right 01/29/2016    S/P FALL.  DR. Lovena Neighbours, [facility].   REPLACEMENT TOTAL KNEE       STATUS POST SHOULDER REPLACEMENT       STATUS POST SHOULDER REPLACEMENT AND REVISION ROTATOR CUFF SURGERY       TOTAL HIP ARTHROPLASTY          Active Medications      Current Meds  Medication Sig   acetaminophen (TYLENOL) 500 MG tablet Take 500 mg by mouth every 6 (six) hours as needed for mild pain.   allopurinol (ZYLOPRIM) 100 MG tablet Take 100 mg by mouth 2 (two) times daily.   amLODipine (NORVASC) 10 MG tablet Take 10 mg by mouth daily.   ascorbic acid (VITAMIN C) 1000 MG tablet Take by mouth.   aspirin 325 MG tablet Take 325 mg by mouth daily.   B Complex Vitamins (VITAMIN B COMPLEX) TABS Take 1 tablet by mouth daily.   Blood Glucose Monitoring Suppl (ONE TOUCH ULTRA 2) w/Device KIT See admin instructions.   Calcium Carbonate-Vitamin D 600-125 MG-UNIT  TABS Take by mouth.   Cholecalciferol 25 MCG (1000 UT) tablet Take by mouth.   Coenzyme Q10 200 MG capsule Take 1 tablet by mouth every morning.   enalapril (VASOTEC) 10 MG tablet Take 10 mg by mouth daily.   Gabapentin, Once-Daily, 300 MG TABS Take by mouth.   Lancets (ONETOUCH DELICA PLUS [id number]) MISC USE 1 TO CHECK GLUCOSE ONCE DAILY   Magnesium Oxide 420 MG TABS Take 1 tablet by mouth every morning.   metFORMIN (GLUCOPHAGE) 500 MG tablet Take 500 mg by mouth. 1000 mg every am and 500 mg evening   Omega-3 Fatty Acids (OMEGA-3 2100) 1050 MG CAPS Take 1 capsule by mouth daily.   ONETOUCH ULTRA test strip USE 1 STRIP TO CHECK GLUCOSE ONCE DAILY   pioglitazone (ACTOS) 30 MG tablet Take by mouth.   rosuvastatin (CRESTOR) 10 MG tablet Take 1 tablet (10 mg total) by mouth daily.   tamsulosin (FLOMAX) 0.4 MG CAPS capsule Take by mouth.        12 system ROS was negative unless otherwise noted in  HPI   Observations/Objective: Vitals:   05/24/21 0651 05/24/21 0653  BP:  (!) 147/49  Pulse:  62  Resp:  18  Temp: 97.7 F (36.5 C)   SpO2:  96%  Awake alert oriented x3 Moving all extremities without limitation Grip strength intact Tongue is midline, smile is symmetric    Assessment and Plan: 87 year old male with high-grade right ICA stenosis that is asymptomatic.  We have again discussed the options to be medical therapy versus carotid endarterectomy as I do not think with his level of calcification that he is a candidate for stenting.  He demonstrates good understanding and is elected for carotid endarterectomy.  He has seen Dr. Bettina Gavia had an echo performed.  Plan right carotid endarterectomy today in the OR.   Ethridge Sollenberger C. Donzetta Matters, MD [facility] Office: [phone] Pager: [phone]

## 2021-05-24 NOTE — Anesthesia Procedure Notes (Signed)
Procedure Name: Intubation Date/Time: 05/24/2021 8:45 AM Performed by: Myna Bright, CRNA Pre-anesthesia Checklist: Patient identified, Emergency Drugs available, Suction available and Patient being monitored Patient Re-evaluated:Patient Re-evaluated prior to induction Oxygen Delivery Method: Circle system utilized Preoxygenation: Pre-oxygenation with 100% oxygen Induction Type: IV induction and Cricoid Pressure applied Ventilation: Mask ventilation without difficulty and Oral airway inserted - appropriate to patient size Laryngoscope Size: Mac and 4 Grade View: Grade III Tube type: Oral Tube size: 7.5 mm Number of attempts: 1 Airway Equipment and Method: Stylet and Oral airway Placement Confirmation: ETT inserted through vocal cords under direct vision, positive ETCO2 and breath sounds checked- equal and bilateral Secured at: 23 cm Tube secured with: Tape Dental Injury: Teeth and Oropharynx as per pre-operative assessment

## 2021-05-24 NOTE — Anesthesia Procedure Notes (Signed)
Arterial Line Insertion Start/End8/12/2020 7:45 AM, 05/24/2021 8:00 AM Performed by: Atilano Median, DO, Kaisley Stiverson, Cristy Friedlander, CRNA, CRNA  Patient location: Pre-op. Preanesthetic checklist: patient identified, IV checked, site marked, risks and benefits discussed, surgical consent, monitors and equipment checked, pre-op evaluation, timeout performed and anesthesia consent Lidocaine 1% used for infiltration Right, radial was placed Catheter size: 20 G Hand hygiene performed  and maximum sterile barriers used  Allen's test indicative of satisfactory collateral circulation Attempts: 2 Procedure performed without using ultrasound guided technique. Following insertion, dressing applied and Biopatch. Post procedure assessment: normal and unchanged  Patient tolerated the procedure well with no immediate complications.

## 2021-05-25 ENCOUNTER — Encounter (HOSPITAL_COMMUNITY): Payer: Self-pay | Admitting: Vascular Surgery

## 2021-05-25 LAB — CBC
HCT: 31.3 % — ABNORMAL LOW (ref 39.0–52.0)
Hemoglobin: 10.7 g/dL — ABNORMAL LOW (ref 13.0–17.0)
MCH: 32 pg (ref 26.0–34.0)
MCHC: 34.2 g/dL (ref 30.0–36.0)
MCV: 93.7 fL (ref 80.0–100.0)
Platelets: 207 10*3/uL (ref 150–400)
RBC: 3.34 MIL/uL — ABNORMAL LOW (ref 4.22–5.81)
RDW: 14.9 % (ref 11.5–15.5)
WBC: 8.5 10*3/uL (ref 4.0–10.5)
nRBC: 0 % (ref 0.0–0.2)

## 2021-05-25 LAB — BASIC METABOLIC PANEL
Anion gap: 9 (ref 5–15)
BUN: 22 mg/dL (ref 8–23)
CO2: 26 mmol/L (ref 22–32)
Calcium: 8.9 mg/dL (ref 8.9–10.3)
Chloride: 98 mmol/L (ref 98–111)
Creatinine, Ser: 0.84 mg/dL (ref 0.61–1.24)
GFR, Estimated: >60 mL/min (ref >60)
Glucose, Bld: 187 mg/dL — ABNORMAL HIGH (ref 70–99)
Potassium: 3.9 mmol/L (ref 3.5–5.1)
Sodium: 133 mmol/L — ABNORMAL LOW (ref 135–145)

## 2021-05-25 LAB — LIPID PANEL
Cholesterol: 84 mg/dL (ref 0–200)
HDL: 45 mg/dL (ref >40)
LDL Cholesterol: 30 mg/dL (ref 0–99)
Total CHOL/HDL Ratio: 1.9 RATIO
Triglycerides: 43 mg/dL (ref <150)
VLDL: 9 mg/dL (ref 0–40)

## 2021-05-25 MED ORDER — SODIUM CHLORIDE 0.9 % IV SOLN
12.5000 mg | INTRAVENOUS | Status: DC | PRN
  Administered 2021-05-25: 12.5 mg via INTRAVENOUS
  Filled 2021-05-25: qty 0.5

## 2021-05-25 MED ORDER — ROSUVASTATIN CALCIUM 20 MG PO TABS: 20.0000 mg | ORAL_TABLET | Freq: Every day | ORAL | Status: DC

## 2021-05-25 MED ORDER — ROSUVASTATIN CALCIUM 20 MG PO TABS: 20.0000 mg | ORAL_TABLET | Freq: Every day | ORAL | Status: AC

## 2021-05-25 MED ORDER — PROMETHAZINE HCL 12.5 MG PO TABS: 12.5000 mg | ORAL_TABLET | Freq: Four times a day (QID) | ORAL | 0 refills | Status: AC | PRN

## 2021-05-25 MED ORDER — HYDROCODONE-ACETAMINOPHEN 5-325 MG PO TABS: 1.0000 | ORAL_TABLET | Freq: Four times a day (QID) | ORAL | 0 refills | Status: AC | PRN

## 2021-05-25 MED ORDER — ONDANSETRON HCL 4 MG PO TABS: 4.0000 mg | ORAL_TABLET | Freq: Every day | ORAL | 1 refills | Status: AC | PRN

## 2021-05-25 NOTE — Progress Notes (Signed)
Patient had two episodes of diarrhea. One incontinent episode and a second after ambulating to the bathroom with walker and NT. States nausea has improved some since receiving Phenergan. Abdomen still appears distended.

## 2021-05-25 NOTE — Discharge Instructions (Signed)
   [facility] [place]  Discharge Instructions   Carotid Endarterectomy (CEA)  Please refer to the following instructions for your post-procedure care. Your surgeon or physician assistant will discuss any changes with you.  Activity  You are encouraged to walk as much as you can. You can slowly return to normal activities but must avoid strenuous activity and heavy lifting until your doctor tell you it's OK. Avoid activities such as vacuuming or swinging a golf club. You can drive after one week if you are comfortable and you are no longer taking prescription pain medications. It is normal to feel tired for serval weeks after your surgery. It is also normal to have difficulty with sleep habits, eating, and bowel movements after surgery. These will go away with time.  Bathing/Showering  You may shower after you come home. Do not soak in a bathtub, hot tub, or swim until the incision heals completely.  Incision Care  Shower every day. Clean your incision with mild soap and water. Pat the area dry with a clean towel. You do not need a bandage unless otherwise instructed. Do not apply any ointments or creams to your incision. You may have skin glue on your incision. Do not peel it off. It will come off on its own in about one week. Your incision may feel thickened and raised for several weeks after your surgery. This is normal and the skin will soften over time. For Men Only: It's OK to shave around the incision but do not shave the incision itself for 2 weeks. It is common to have numbness under your chin that could last for several months.  Diet  Resume your normal diet. There are no special food restrictions following this procedure. A low fat/low cholesterol diet is recommended for all patients with vascular disease. In order to heal from your surgery, it is CRITICAL to get adequate nutrition. Your body requires vitamins, minerals, and protein. Vegetables are the best  source of vitamins and minerals. Vegetables also provide the perfect balance of protein. Processed food has little nutritional value, so try to avoid this.        Medications  Resume taking all of your medications unless your doctor or physician assistant tells you not to. If your incision is causing pain, you may take over-the- counter pain relievers such as acetaminophen (Tylenol). If you were prescribed a stronger pain medication, please be aware these medications can cause nausea and constipation. Prevent nausea by taking the medication with a snack or meal. Avoid constipation by drinking plenty of fluids and eating foods with a high amount of fiber, such as fruits, vegetables, and grains. Do not take Tylenol if you are taking prescription pain medications.  Follow Up  Our office will schedule a follow up appointment 2-3 weeks following discharge.  Please call us immediately for any of the following conditions  Increased pain, redness, drainage (pus) from your incision site. Fever of 101 degrees or higher. If you should develop stroke (slurred speech, difficulty swallowing, weakness on one side of your body, loss of vision) you should call 911 and go to the nearest emergency room.  Reduce your risk of vascular disease:  Stop smoking. If you would like help call [facility] at 1-800-QUIT-NOW (1-800-784-8669) or [place] at 336-586-4000. Manage your cholesterol Maintain a desired weight Control your diabetes Keep your blood pressure down  If you have any questions, please call the office at [phone].   

## 2021-05-25 NOTE — Progress Notes (Addendum)
   VASCULAR SURGERY ASSESSMENT & PLAN:   POD 1 right CEA for asymptomatic stenosis. Has had episodes of N, V and diarrhea yesterday. Hemodynamically stable. Neuro intact.  Continue gentle IVF hydration, PO sips as tolerated and watch electrolytes. Repeat labs in AM.   SUBJECTIVE:   Currently mildly nauseated, but resting comfortably. Alternating Phenergan and Zofran. He denies difficulty swallowing.   PHYSICAL EXAM:   Vitals:   05/24/21 2200 05/25/21 0000 05/25/21 0200 05/25/21 0400  BP: (!) 153/72 134/60 (!) 149/66 (!) 161/72  Pulse: 72 69 73 78  Resp: 16 16 17 19   Temp: 97.7 F (36.5 C) 97.8 F (36.6 C) 97.8 F (36.6 C) 98.3 F (36.8 C)  TempSrc: Oral Oral Oral Oral  SpO2: 90% 90% 91% 90%  Weight:      Height:       General appearance: Awake, alert in no apparent distress Neurologic: Alert and oriented x4, tongue midline, face symmetric, grip strength 5/5 bilaterally. Speech clear Cardiac: Heart rate and rhythm are regular Respirations: Nonlabored Incision: Mild edema. Well approximated without bleeding.  Subcutaneous tissue soft to palpation   LABS:   Lab Results  Component Value Date   WBC 8.5 05/25/2021   HGB 10.7 (L) 05/25/2021   HCT 31.3 (L) 05/25/2021   MCV 93.7 05/25/2021   PLT 207 05/25/2021   Lab Results  Component Value Date   CREATININE 0.84 05/25/2021   Lab Results  Component Value Date   INR 1.0 05/15/2021   CBG (last 3)  Recent Labs    05/24/21 0655 05/24/21 1102  GLUCAP 144* 157*    PROBLEM LIST:    Active Problems:   Carotid stenosis, right   CURRENT MEDS:    allopurinol  100 mg Oral BID   amLODipine  10 mg Oral Daily   aspirin  325 mg Oral Daily   B-complex with vitamin C  1 tablet Oral Daily   calcium-vitamin D  1 tablet Oral Daily   Chlorhexidine Gluconate Cloth  6 each Topical Q0600   docusate sodium  100 mg Oral Daily   enalapril  10 mg Oral Daily   gabapentin  100 mg Oral TID   heparin  5,000 Units Subcutaneous Q8H    magnesium oxide  400 mg Oral q morning   metFORMIN  1,000 mg Oral Q breakfast   metFORMIN  500 mg Oral Q supper   pantoprazole  40 mg Oral Daily   pioglitazone  30 mg Oral Daily   rosuvastatin  10 mg Oral Daily   tamsulosin  0.4 mg Oral Daily   07/24/21, Milinda Antis  Office: [phone] 05/25/2021   I have independently interviewed and examined patient and agree with PA assessment and plan above.  He has complained of multiple episodes of nausea and vomiting and will need to control this prior to discharge.  He does not have any difficulty swallowing.  His only symptom related to the surgery appears to be hoarseness which more than likely is due to the need for large excision for exposure and retraction necessary combined with intubation.  We will continue to monitor for possible discharge this afternoon versus tomorrow.  Marilla Boddy C. 07/25/2021, MD [facility] Office: [phone] Pager: [phone]

## 2021-05-25 NOTE — Progress Notes (Signed)
Patient having nausea and vomiting that persists after second dose of IV Zofran. Contacted Dr. Myra Gianotti and received order for  12.5 mg IV Phenergan every 4 hours to be alternated with Zofran if nausea unrelieved.

## 2021-05-25 NOTE — Discharge Summary (Signed)
Discharge Summary     Jermaine Hester 08-25-1930 87 y.o. male  [id number]  Admission Date: 05/24/2021  Discharge Date: 05/25/2021 Physician: Dr. Donzetta Matters  Admission Diagnosis: Carotid stenosis, right [I65.21]   HPI:   This is a 87 y.o. male with asymptomatic right ICA stenosis.  Hospital Course:  The patient was admitted to the hospital and taken to the operating room on 05/24/2021 and underwent right carotid endarterectomy.    Findings: The common carotid artery itself was very deep.  We did divide multiple branches of facial veins for exposure.  The hypoglossal nerve was identified and protected.  The vagus nerve was also identified.  As the artery was very deep I elected not to shunt.  There was very strong backbleeding from the ICA distally.  After patch angioplasty and endarterectomy of the common carotid artery extending into the internal carotid artery there were expected waveforms that were low resistance in the distal ICA with somewhat high resistant waveforms in the ECA.  Upon awakening from anesthesia patient was noted to be neurologically intact.  The pt tolerated the procedure well and was transported to the PACU in excellent condition.   By POD 1, the pt neuro status intact. His vital signs were stable. He had nausea and some vomiting immediately post-op which was controlled with anti-emetics. IVFs were continued. He tolerated solid foods at lunchtime without dysphagia. He had moderate hoarseness. He remained hemodynamically stable. He was ready for discharge home with family.  The remainder of the hospital course consisted of increasing mobilization and increasing intake of solids without difficulty.   Recent Labs    05/25/21 0310  NA 133*  K 3.9  CL 98  CO2 26  GLUCOSE 187*  BUN 22  CALCIUM 8.9   Recent Labs    05/24/21 1135 05/25/21 0310  WBC 7.6 8.5  HGB 9.4* 10.7*  HCT 28.9* 31.3*  PLT 193 207   No results for input(s): INR in the last 72  hours.   Discharge Instructions     Discharge patient   Complete by: As directed    Discharge disposition: 01-Home or Self Care   Discharge patient date: 05/25/2021       Discharge Diagnosis:  Carotid stenosis, right [I65.21]  Secondary Diagnosis: Patient Active Problem List   Diagnosis Date Noted   Carotid stenosis, right 05/24/2021   Arthropathy of right shoulder 04/07/2021   BPH with obstruction/lower urinary tract symptoms 04/07/2021   Diabetic neuropathy ([place]) 04/07/2021   Dyslipidemia 04/07/2021   ICAO (internal carotid artery occlusion), bilateral 04/07/2021   Idiopathic chronic gout of multiple sites without tophus 04/07/2021   Morbid obesity ([place]) 04/07/2021   Primary localized osteoarthrosis of multiple sites 04/07/2021   Psoriasis 04/07/2021   Statin myopathy 04/07/2021   Gout 02/28/2021   History of fungal skin infection 2021   Scar tissue 2021   Acute blood loss anemia 01/31/2016   Periprosthetic fracture of shaft of femur 01/28/2016   Type 2 diabetes mellitus without complication, without long-term current use of insulin ([place]) 01/28/2016   OA (osteoarthritis) of hip 08/28/2013   S/P total hip arthroplasty 08/28/2013   Hypermetropia 07/14/2013   Presbyopia 07/14/2013   Regular astigmatism 07/14/2013   Senile nuclear sclerosis 07/14/2013   Retrolisthesis 03/05/2013   DDD (degenerative disc disease), lumbosacral 03/05/2013   Spinal stenosis of lumbar region without neurogenic claudication 03/05/2013   Rotator cuff tear 02/10/2013   Essential hypertension 02/06/2013   Post-operative nausea and vomiting 02/06/2013   Sleep apnea  02/06/2013   Surgery, elective 02/06/2013   History of shoulder replacement 01/28/2013   Pain 01/06/2013   Shoulder joint pain 01/06/2013   Past Medical History:  Diagnosis Date   Arthropathy of right shoulder    Benign essential hypertension    BPH with obstruction/lower urinary tract symptoms    Diabetic neuropathy (H[facility])     Dyslipidemia    Dyslipidemia    History of fungal skin infection 2021   Significant over joint   ICAO (internal carotid artery occlusion), bilateral    Noted on recent CTA scans   Idiopathic chronic gout of multiple sites without tophus    Morbid obesity ([place])    Primary localized osteoarthrosis of multiple sites    Psoriasis    Scar tissue 2021   Sleep apnea    Statin myopathy    Type 2 diabetes mellitus with hyperlipidemia (HCC)     Allergies as of 05/25/2021       Reactions   Penicillins Itching, Rash   Sulfa Antibiotics Itching, Rash   Morphine And Related Other (See Comments)   Makes pt feel crazy        Medication List     TAKE these medications    acetaminophen 500 MG tablet Commonly known as: TYLENOL Take 500 mg by mouth every 6 (six) hours as needed for mild pain.   allopurinol 100 MG tablet Commonly known as: ZYLOPRIM Take 100 mg by mouth 2 (two) times daily.   amLODipine 10 MG tablet Commonly known as: NORVASC Take 10 mg by mouth daily.   ascorbic acid 1000 MG tablet Commonly known as: VITAMIN C Take 1,000 mg by mouth daily.   aspirin 325 MG tablet Take 325 mg by mouth daily.   Calcium Carbonate-Vitamin D 600-125 MG-UNIT Tabs Take 1 tablet by mouth daily.   Cholecalciferol 25 MCG (1000 UT) tablet Take 1,000 Units by mouth daily.   Coenzyme Q10 200 MG capsule Take 200 mg by mouth every morning.   enalapril 10 MG tablet Commonly known as: VASOTEC Take 10 mg by mouth daily.   Fish Oil 500 MG Caps Take 500 mg by mouth daily.   Gabapentin (Once-Daily) 300 MG Tabs Take 300 mg by mouth daily.   HYDROcodone-acetaminophen 5-325 MG tablet Commonly known as: NORCO/VICODIN Take 1 tablet by mouth every 6 (six) hours as needed for moderate pain.   Magnesium Oxide 420 MG Tabs Take 420 mg by mouth every morning.   metFORMIN 500 MG tablet Commonly known as: GLUCOPHAGE Take 500-1,000 mg by mouth See admin instructions. 1000 mg every am and 500 mg  evening   ondansetron 4 MG tablet Commonly known as: Zofran Take 1 tablet (4 mg total) by mouth daily as needed for nausea or vomiting.   ONE TOUCH ULTRA 2 w/Device Kit See admin instructions.   OneTouch Delica Plus [id number] Misc USE 1 TO CHECK GLUCOSE ONCE DAILY   OneTouch Ultra test strip Generic drug: glucose blood USE 1 STRIP TO CHECK GLUCOSE ONCE DAILY   pioglitazone 30 MG tablet Commonly known as: ACTOS Take 30 mg by mouth daily.   promethazine 12.5 MG tablet Commonly known as: PHENERGAN Take 1 tablet (12.5 mg total) by mouth every 6 (six) hours as needed for nausea or vomiting.   rosuvastatin 20 MG tablet Commonly known as: CRESTOR Take 1 tablet (20 mg total) by mouth daily. Start taking on: May 26, 2021 What changed:  medication strength how much to take   tamsulosin 0.4 MG Caps capsule Commonly  known as: FLOMAX Take 0.4 mg by mouth daily.   Vitamin B Complex Tabs Take 1 tablet by mouth daily.         [facility] Discharge Instructions Carotid Endarterectomy (CEA)  Please refer to the following instructions for your post-procedure care. Your surgeon or physician assistant will discuss any changes with you.  Activity  You are encouraged to walk as much as you can. You can slowly return to normal activities but must avoid strenuous activity and heavy lifting until your doctor tell you it's OK. Avoid activities such as vacuuming or swinging a golf club. You can drive after one week if you are comfortable and you are no longer taking prescription pain medications. It is normal to feel tired for serval weeks after your surgery. It is also normal to have difficulty with sleep habits, eating, and bowel movements after surgery. These will go away with time.  Bathing/Showering  You may shower after you come home. Do not soak in a bathtub, hot tub, or swim until the incision heals completely.  Incision Care  Shower every day.  Clean your incision with mild soap and water. Pat the area dry with a clean towel. You do not need a bandage unless otherwise instructed. Do not apply any ointments or creams to your incision. You may have skin glue on your incision. Do not peel it off. It will come off on its own in about one week. Your incision may feel thickened and raised for several weeks after your surgery. This is normal and the skin will soften over time. For Men Only: It's OK to shave around the incision but do not shave the incision itself for 2 weeks. It is common to have numbness under your chin that could last for several months.  Diet  Resume your normal diet. There are no special food restrictions following this procedure. A low fat/low cholesterol diet is recommended for all patients with vascular disease. In order to heal from your surgery, it is CRITICAL to get adequate nutrition. Your body requires vitamins, minerals, and protein. Vegetables are the best source of vitamins and minerals. Vegetables also provide the perfect balance of protein. Processed food has little nutritional value, so try to avoid this.  Medications  Resume taking all of your medications unless your doctor or physician assistant tells you not to.  If your incision is causing pain, you may take over-the- counter pain relievers such as acetaminophen (Tylenol). If you were prescribed a stronger pain medication, please be aware these medications can cause nausea and constipation.  Prevent nausea by taking the medication with a snack or meal. Avoid constipation by drinking plenty of fluids and eating foods with a high amount of fiber, such as fruits, vegetables, and grains.  Do not take Tylenol if you are taking prescription pain medications.  Follow Up  Our office will schedule a follow up appointment 2-3 weeks following discharge.  Please call us immediately for any of the following conditions  Increased pain, redness, drainage (pus) from your  incision site. Fever of 101 degrees or higher. If you should develop stroke (slurred speech, difficulty swallowing, weakness on one side of your body, loss of vision) you should call 911 and go to the nearest emergency room.  Reduce your risk of vascular disease:  Stop smoking. If you would like help call [facility] at 1-800-QUIT-NOW (716)847-7390) or [place] at [phone]. Manage your cholesterol Maintain a desired weight Control your diabetes Keep your  blood pressure down  If you have any questions, please call the office at [phone].  Prescriptions given: 1.   Roxicet #10 No Refill 2.  Zofran 3. Phenergan  Disposition: home  Patient's condition: is Excellent  Follow up: 1. Dr. Donzetta Matters in 2 weeks.   Risa Grill, PA-C [facility] [phone]   --- For [facility] use ---   Modified Rankin score at D/C (0-6): 0  IV medication needed for:  1. Hypertension: No 2. Hypotension: No  Post-op Complications: No  1. Post-op CVA or TIA: No  If yes: Event classification (right eye, left eye, right cortical, left cortical, verterobasilar, other):   If yes: Timing of event (intra-op, <6 hrs post-op, >=6 hrs post-op, unknown):   2. CN injury: No  If yes: CN  injuried   3. Myocardial infarction: No  If yes: Dx by (EKG or clinical, Troponin):   4.  CHF: No  5.  Dysrhythmia (new): No  6. Wound infection: No  7. Reperfusion symptoms: No  8. Return to OR: No  If yes: return to OR for (bleeding, neurologic, other CEA incision, other):   Discharge medications: Statin use:  Yes ASA use:  Yes   Beta blocker use:  No ACE-Inhibitor use:  Yes  ARB use:  No CCB use: Yes P2Y12 Antagonist use: No, [ ]  Plavix, [ ]  Plasugrel, [ ]  Ticlopinine, [ ]  Ticagrelor, [ ]  Other, [ ]  No for medical reason, [ ]  Non-compliant, [ ]  Not-indicated Anti-coagulant use:  No, [ ]  Warfarin, [ ]  Rivaroxaban, [ ]  Dabigatran,

## 2021-05-25 NOTE — Progress Notes (Signed)
VSS. Tolerating diet. Neuro intact. Family at bedside. Ready for discharge in satisfactory condition.

## 2021-05-25 NOTE — Progress Notes (Signed)
PHARMACIST LIPID MONITORING   Jermaine Hester is a 87 y.o. male admitted on 05/24/2021 s/p CEA.  Pharmacy has been consulted to optimize lipid-lowering therapy with the indication of secondary prevention for clinical ASCVD.  Recent Labs:  Lipid Panel (last 6 months):   Lab Results  Component Value Date   CHOL 84 05/25/2021   TRIG 43 05/25/2021   HDL 45 05/25/2021   CHOLHDL 1.9 05/25/2021   VLDL 9 05/25/2021   LDLCALC 30 05/25/2021    Hepatic function panel (last 6 months):   Lab Results  Component Value Date   AST 24 05/15/2021   ALT 13 05/15/2021   ALKPHOS 53 05/15/2021   BILITOT 0.5 05/15/2021    SCr (since admission):   Serum creatinine: 0.84 mg/dL 72/62/03 5597 Estimated creatinine clearance: 67.6 mL/min  Current therapy and lipid therapy tolerance Current lipid-lowering therapy: crestor 10mg /d Documented or reported allergies or intolerances to lipid-lowering therapies (if applicable): none   Plan:    1.Statin intensity (high intensity recommended for all patients regardless of the LDL):  Add or increase statin to high intensity.  2.Add ezetimibe (if any one of the following):   Not indicated at this time.  3.Refer to lipid clinic:   No  4.Follow-up with:  Primary care provider - Street, , MD  5.Follow-up labs after discharge:   -LDL at goal. Repeat lipid panel in 1 year  Stephanie Coup, PharmD Clinical Pharmacist **Pharmacist phone directory can now be found on [email or web address] ([email or web address]).  Listed under [facility].

## 2021-06-08 ENCOUNTER — Other Ambulatory Visit: Payer: Self-pay

## 2021-06-08 ENCOUNTER — Ambulatory Visit (INDEPENDENT_AMBULATORY_CARE_PROVIDER_SITE_OTHER): Payer: HMO | Admitting: Physician Assistant

## 2021-06-08 VITALS — BP 153/67 | HR 74 | Temp 98.0°F | Resp 20 | Ht 68.0 in | Wt 218.0 lb

## 2021-06-08 DIAGNOSIS — I6521 Occlusion and stenosis of right carotid artery: Secondary | ICD-10-CM

## 2021-06-08 NOTE — Progress Notes (Signed)
Postoperative Visit   History of Present Illness   Jermaine Hester is a 87 y.o. male who presents for postoperative follow-up for: right CEA for high grade asymptomatic stenosis (Date: 05/24/21).  The patient's neck incision is healed.  He denies any strokelike symptoms following surgery including slurred speech, changes in vision, or one-sided weakness.  He does report hoarse voice and loss of voice which has only minimally improved since surgery.  He denies trouble swallowing.  Current Outpatient Medications  Medication Sig Dispense Refill   acetaminophen (TYLENOL) 500 MG tablet Take 500 mg by mouth every 6 (six) hours as needed for mild pain.     allopurinol (ZYLOPRIM) 100 MG tablet Take 100 mg by mouth 2 (two) times daily.     amLODipine (NORVASC) 10 MG tablet Take 10 mg by mouth daily.     ascorbic acid (VITAMIN C) 1000 MG tablet Take 1,000 mg by mouth daily.     aspirin 325 MG tablet Take 325 mg by mouth daily.     B Complex Vitamins (VITAMIN B COMPLEX) TABS Take 1 tablet by mouth daily.     Blood Glucose Monitoring Suppl (ONE TOUCH ULTRA 2) w/Device KIT See admin instructions.     Calcium Carbonate-Vitamin D 600-125 MG-UNIT TABS Take 1 tablet by mouth daily.     Cholecalciferol 25 MCG (1000 UT) tablet Take 1,000 Units by mouth daily.     Coenzyme Q10 200 MG capsule Take 200 mg by mouth every morning.     enalapril (VASOTEC) 10 MG tablet Take 10 mg by mouth daily.     Gabapentin, Once-Daily, 300 MG TABS Take 300 mg by mouth daily.     HYDROcodone-acetaminophen (NORCO/VICODIN) 5-325 MG tablet Take 1 tablet by mouth every 6 (six) hours as needed for moderate pain. 6 tablet 0   Lancets (ONETOUCH DELICA PLUS [id number]) MISC USE 1 TO CHECK GLUCOSE ONCE DAILY     Magnesium Oxide 420 MG TABS Take 420 mg by mouth every morning.     metFORMIN (GLUCOPHAGE) 500 MG tablet Take 500-1,000 mg by mouth See admin instructions. 1000 mg every am and 500 mg evening     Omega-3 Fatty Acids (FISH OIL)  500 MG CAPS Take 500 mg by mouth daily.     ondansetron (ZOFRAN) 4 MG tablet Take 1 tablet (4 mg total) by mouth daily as needed for nausea or vomiting. 10 tablet 1   ONETOUCH ULTRA test strip USE 1 STRIP TO CHECK GLUCOSE ONCE DAILY     pioglitazone (ACTOS) 30 MG tablet Take 30 mg by mouth daily.     promethazine (PHENERGAN) 12.5 MG tablet Take 1 tablet (12.5 mg total) by mouth every 6 (six) hours as needed for nausea or vomiting. 15 tablet 0   rosuvastatin (CRESTOR) 20 MG tablet Take 1 tablet (20 mg total) by mouth daily.     tamsulosin (FLOMAX) 0.4 MG CAPS capsule Take 0.4 mg by mouth daily.     No current facility-administered medications for this visit.    For [facility] Use Only   PRE-ADM LIVING: Home  AMB STATUS: Ambulatory   Physical Examination   Vitals:   06/08/21 1133 06/08/21 1134  BP: (!) 150/67 (!) 153/67  Pulse: 74   Resp: 20   Temp: 98 F (36.7 C)   SpO2: 99%   Weight: 218 lb (98.9 kg)   Height: 5' 8"  (1.727 m)     right Neck: Incision is healed  Neuro: CN 2-12 are grossly intact;  hoarse/lack of voice   Medical Decision Making   Jermaine Hester is a 87 y.o. male who presents s/p right CEA.  Right neck incision well-healed Patient's neuro exam remains unchanged compared to preoperative Patient does have a hoarse/lack of voice with only minimal improvement since surgery; I will discussed this with Dr. Donzetta Matters however I discussed with the patient that this will take time to return to normal Recheck carotid duplex in 9 months per protocol  Jermaine Ligas PA-C [facility] [place] Office: [phone]  Clinic MD: Scot Dock

## 2021-06-09 ENCOUNTER — Other Ambulatory Visit: Payer: Self-pay

## 2021-06-09 DIAGNOSIS — I6521 Occlusion and stenosis of right carotid artery: Secondary | ICD-10-CM

## 2021-06-15 ENCOUNTER — Telehealth: Payer: Self-pay | Admitting: Cardiology

## 2021-06-15 NOTE — Telephone Encounter (Signed)
Pt has questions about his Crestor. Pt states that Dr. Dulce Sellar told him to take 10mg  once daily. After seeing Dr. , Dr. Randie Heinz said to take 20mg . He is now close to running out and needs a refill since amount does not reflect his original rx. Best number to reach him is [phone].  Thank you!

## 2021-06-16 MED ORDER — ROSUVASTATIN CALCIUM 20 MG PO TABS: 20.0000 mg | ORAL_TABLET | Freq: Every day | ORAL | 1 refills | Status: DC

## 2021-06-16 NOTE — Telephone Encounter (Signed)
Per Dr. Dulce Sellar approved Crestor 20 qd. Rx sent , patient notified

## 2021-07-06 DIAGNOSIS — E785 Hyperlipidemia, unspecified: Secondary | ICD-10-CM | POA: Diagnosis not present

## 2021-07-06 DIAGNOSIS — E1169 Type 2 diabetes mellitus with other specified complication: Secondary | ICD-10-CM | POA: Diagnosis not present

## 2021-07-21 DIAGNOSIS — E1169 Type 2 diabetes mellitus with other specified complication: Secondary | ICD-10-CM | POA: Diagnosis not present

## 2021-07-21 DIAGNOSIS — I10 Essential (primary) hypertension: Secondary | ICD-10-CM | POA: Diagnosis not present

## 2021-07-21 DIAGNOSIS — E785 Hyperlipidemia, unspecified: Secondary | ICD-10-CM | POA: Diagnosis not present

## 2021-08-21 DIAGNOSIS — I10 Essential (primary) hypertension: Secondary | ICD-10-CM | POA: Diagnosis not present

## 2021-08-21 DIAGNOSIS — M109 Gout, unspecified: Secondary | ICD-10-CM | POA: Diagnosis not present

## 2021-09-20 DIAGNOSIS — E785 Hyperlipidemia, unspecified: Secondary | ICD-10-CM | POA: Diagnosis not present

## 2021-09-20 DIAGNOSIS — I10 Essential (primary) hypertension: Secondary | ICD-10-CM | POA: Diagnosis not present

## 2021-09-20 DIAGNOSIS — E1169 Type 2 diabetes mellitus with other specified complication: Secondary | ICD-10-CM | POA: Diagnosis not present

## 2021-10-24 DIAGNOSIS — E785 Hyperlipidemia, unspecified: Secondary | ICD-10-CM | POA: Diagnosis not present

## 2021-10-24 DIAGNOSIS — E1165 Type 2 diabetes mellitus with hyperglycemia: Secondary | ICD-10-CM | POA: Diagnosis not present

## 2021-10-24 DIAGNOSIS — I110 Hypertensive heart disease with heart failure: Secondary | ICD-10-CM | POA: Diagnosis not present

## 2021-10-24 DIAGNOSIS — G8929 Other chronic pain: Secondary | ICD-10-CM | POA: Diagnosis not present

## 2021-10-24 DIAGNOSIS — E538 Deficiency of other specified B group vitamins: Secondary | ICD-10-CM | POA: Diagnosis not present

## 2021-10-24 DIAGNOSIS — E559 Vitamin D deficiency, unspecified: Secondary | ICD-10-CM | POA: Diagnosis not present

## 2021-10-24 DIAGNOSIS — E1151 Type 2 diabetes mellitus with diabetic peripheral angiopathy without gangrene: Secondary | ICD-10-CM | POA: Diagnosis not present

## 2021-10-24 DIAGNOSIS — E261 Secondary hyperaldosteronism: Secondary | ICD-10-CM | POA: Diagnosis not present

## 2021-10-24 DIAGNOSIS — E1169 Type 2 diabetes mellitus with other specified complication: Secondary | ICD-10-CM | POA: Diagnosis not present

## 2021-10-24 DIAGNOSIS — I509 Heart failure, unspecified: Secondary | ICD-10-CM | POA: Diagnosis not present

## 2021-10-24 DIAGNOSIS — E1142 Type 2 diabetes mellitus with diabetic polyneuropathy: Secondary | ICD-10-CM | POA: Diagnosis not present

## 2021-10-30 DIAGNOSIS — R351 Nocturia: Secondary | ICD-10-CM | POA: Diagnosis not present

## 2021-10-30 DIAGNOSIS — N401 Enlarged prostate with lower urinary tract symptoms: Secondary | ICD-10-CM | POA: Diagnosis not present

## 2021-11-12 NOTE — Progress Notes (Signed)
Cardiology Office Note:    Date:  11/13/2021   ID:  Jermaine Hester, DOB Aug 05, 1930, MRN [id number]  PCP:  Street, Sharon Mt, MD  Cardiologist:  Shirlee More, MD    Referring MD: [address] Sharon Mt, *    ASSESSMENT:    1. Stenosis of right carotid artery   2. History of carotid endarterectomy   3. Essential hypertension   4. Dyslipidemia    PLAN:    In order of problems listed above:  Overall Jermaine Hester is doing well he is made a good recovery from uncomplicated carotid endarterectomy he is on appropriate treatment antiplatelet antihypertensive lipid-lowering statin and diabetic.  I did ask him to reduce his aspirin dosage to avoid bleeding.  I think his blood pressure is at target for his age at age 88 systolic is a goal to try to keep diastolics greater than 60 to avoid hypotensive symptoms he will continue his combination amlodipine and ACE inhibitor.  His lipids are at target labs are followed through his PCP. I told him I can see him back in the office again in the future as needed.   Next appointment: As needed   Medication Adjustments/Labs and Tests Ordered: Current medicines are reviewed at length with the patient today.  Concerns regarding medicines are outlined above.  No orders of the defined types were placed in this encounter.  No orders of the defined types were placed in this encounter.   Chief Complaint  Patient presents with   Follow-up    He was seen preoperatively prior to right carotid endarterectomy history of hypertension hyperlipidemia and echocardiogram showed moderate left atrial enlargement.    History of Present Illness:    Jermaine Hester is a 88 y.o. male with a hx of severe right internal carotid stenosis hypertension type 2 diabetes hyperlipidemia last seen 04/20/2021 prior to carotid endarterectomy.CT angiography of the neck 01/17/2021 showed heavily calcified stenosis greater than 70% proximal right ICA and tortuosity luminal  irregularity and stenosis right vertebral artery at the level of C2.  He had uncomplicated carotid endarterectomy performed 0823 2022 with arrangements for follow-up duplex screening at [facility].   Compliance with diet, lifestyle and medications: Yes  Reviewed good recovery from the surgery and follow-up with CT surgery has arrangements for follow-up for screening carotid duplex He does not check his blood pressure at home He tolerates his statin without muscle pain or weakness he is taking high-dose aspirin number to have been reduced to 81 mg daily as he is anemic and may be iron deficient he is taking over-the-counter multivitamin with iron. No cardiovascular symptoms of chest pain edema shortness of breath palpitation or syncope  He underwent an echocardiogram 04/21/2021 as part of preoperative evaluation. Left ventricle is normal in size wall thickness normal ejection fraction 60 to 65% normal systolic function and grade 1 diastolic dysfunction right ventricle is normal size function and pulmonary artery pressure left atrium is moderately enlarged and there was no significant valvular abnormality.  1. Left ventricular ejection fraction, by estimation, is 60 to 65%. The  left ventricle has normal function. The left ventricle has no regional  wall motion abnormalities. Left ventricular diastolic parameters are  consistent with Grade I diastolic  dysfunction (impaired relaxation).   2. Right ventricular systolic function is normal. The right ventricular  size is normal. There is normal pulmonary artery systolic pressure.   3. Left atrial size was moderately dilated.   4. The mitral valve  is normal in structure. No evidence of mitral valve  regurgitation. No evidence of mitral stenosis.   5. The aortic valve is normal in structure. Aortic valve regurgitation is  not visualized. No aortic stenosis is present.   6. The inferior vena cava is normal in size with  greater than 50%  respiratory variability, suggesting right atrial pressure of 3 mmHg.  Past Medical History:  Diagnosis Date   Arthropathy of right shoulder    Benign essential hypertension    BPH with obstruction/lower urinary tract symptoms    Diabetic neuropathy (HCC)    Dyslipidemia    Dyslipidemia    History of fungal skin infection 2021   Significant over joint   ICAO (internal carotid artery occlusion), bilateral    Noted on recent CTA scans   Idiopathic chronic gout of multiple sites without tophus    Morbid obesity ([place])    Primary localized osteoarthrosis of multiple sites    Psoriasis    Scar tissue 2021   Sleep apnea    Statin myopathy    Type 2 diabetes mellitus with hyperlipidemia [facility])     Past Surgical History:  Procedure Laterality Date   ENDARTERECTOMY Right 05/24/2021   Procedure: RIGHT CAROTID ARTERY ENDARTERECTOMY;  Surgeon: Waynetta Sandy, MD;  Location: [place];  Service: Vascular;  Laterality: Right;   MULTIPLE ORTHOPEDIC PROCEDURES     ORIF FEMUR FRACTURE Right 01/29/2016   S/P FALL.  DR. Lovena Neighbours, Mikel Cella.   PATCH ANGIOPLASTY Right 05/24/2021   Procedure: PATCH ANGIOPLASTY;  Surgeon: Waynetta Sandy, MD;  Location: [facility] OR;  Service: Vascular;  Laterality: Right;   REPLACEMENT TOTAL KNEE Bilateral    STATUS POST SHOULDER REPLACEMENT Bilateral    STATUS POST SHOULDER REPLACEMENT AND REVISION ROTATOR CUFF SURGERY     TOTAL HIP ARTHROPLASTY Bilateral     Current Medications: Current Meds  Medication Sig   acetaminophen (TYLENOL) 500 MG tablet Take 500 mg by mouth every 6 (six) hours as needed for mild pain.   allopurinol (ZYLOPRIM) 100 MG tablet Take 100 mg by mouth 2 (two) times daily.   amLODipine (NORVASC) 10 MG tablet Take 10 mg by mouth daily.   ascorbic acid (VITAMIN C) 1000 MG tablet Take 1,000 mg by mouth daily.   aspirin 325 MG tablet Take 325 mg by mouth daily.   B Complex Vitamins (VITAMIN B COMPLEX) TABS Take 1 tablet by  mouth daily.   Blood Glucose Monitoring Suppl (ONE TOUCH ULTRA 2) w/Device KIT See admin instructions.   Calcium Carbonate-Vitamin D 600-125 MG-UNIT TABS Take 1 tablet by mouth daily.   Cholecalciferol 25 MCG (1000 UT) tablet Take 1,000 Units by mouth daily.   Coenzyme Q10 200 MG capsule Take 200 mg by mouth every morning.   enalapril (VASOTEC) 10 MG tablet Take 10 mg by mouth daily.   Gabapentin, Once-Daily, 300 MG TABS Take 300 mg by mouth daily.   HYDROcodone-acetaminophen (NORCO/VICODIN) 5-325 MG tablet Take 1 tablet by mouth every 6 (six) hours as needed for moderate pain.   Lancets (ONETOUCH DELICA PLUS [id number]) MISC USE 1 TO CHECK GLUCOSE ONCE DAILY   Magnesium Oxide 420 MG TABS Take 420 mg by mouth every morning.   metFORMIN (GLUCOPHAGE) 500 MG tablet Take 500-1,000 mg by mouth See admin instructions. 1000 mg every am and 500 mg evening   Omega-3 Fatty Acids (FISH OIL) 500 MG CAPS Take 500 mg by mouth daily.   ondansetron (ZOFRAN) 4 MG tablet Take 1 tablet (4 mg  total) by mouth daily as needed for nausea or vomiting.   ONETOUCH ULTRA test strip USE 1 STRIP TO CHECK GLUCOSE ONCE DAILY   pioglitazone (ACTOS) 30 MG tablet Take 30 mg by mouth daily.   promethazine (PHENERGAN) 12.5 MG tablet Take 1 tablet (12.5 mg total) by mouth every 6 (six) hours as needed for nausea or vomiting.   rosuvastatin (CRESTOR) 20 MG tablet Take 1 tablet (20 mg total) by mouth daily.   tamsulosin (FLOMAX) 0.4 MG CAPS capsule Take 0.4 mg by mouth daily.     Allergies:   Penicillins, Sulfa antibiotics, and Morphine and related   Social History   Socioeconomic History   Marital status: Married    Spouse name: Not on file   Number of children: Not on file   Years of education: Not on file   Highest education level: Not on file  Occupational History   Not on file  Tobacco Use   Smoking status: Former    Types: Cigarettes    Passive exposure: Past   Smokeless tobacco: Never  Vaping Use   Vaping Use:  Never used  Substance and Sexual Activity   Alcohol use: Never   Drug use: Never   Sexual activity: Not on file  Other Topics Concern   Not on file  Social History Narrative   Not on file   Social Determinants of Health   Financial Resource Strain: Not on file  Food Insecurity: Not on file  Transportation Needs: Not on file  Physical Activity: Not on file  Stress: Not on file  Social Connections: Not on file     Family History: The patient's family history includes Brain cancer in his mother; Heart Problems in his brother; Heart attack (age of onset: 70) in his brother; Heart attack (age of onset: 15) in his father; Parkinson's disease (age of onset: 13) in his brother; Stroke in his mother; Stroke (age of onset: 7) in his sister. ROS:   Please see the history of present illness.    All other systems reviewed and are negative.  EKGs/Labs/Other Studies Reviewed:    The following studies were reviewed today:   Recent Labs: 05/15/2021: ALT 13 05/25/2021: BUN 22; Creatinine, Ser 0.84; Hemoglobin 10.7; Platelets 207; Potassium 3.9; Sodium 133  Recent Lipid Panel    Component Value Date/Time   CHOL 84 05/25/2021 0310   TRIG 43 05/25/2021 0310   HDL 45 05/25/2021 0310   CHOLHDL 1.9 05/25/2021 0310   VLDL 9 05/25/2021 0310   LDLCALC 30 05/25/2021 0310    Physical Exam:    VS:  BP (!) 150/68 (BP Location: Left Arm)    Pulse 78    Ht 5' 8"  (1.727 m)    Wt 217 lb 9.6 oz (98.7 kg)    SpO2 97%    BMI 33.09 kg/m     Wt Readings from Last 3 Encounters:  11/13/21 217 lb 9.6 oz (98.7 kg)  06/08/21 218 lb (98.9 kg)  05/24/21 216 lb (98 kg)    Repeat blood pressure by me 140/60 GEN: He looks his age well nourished, well developed in no acute distress HEENT: Normal NECK: No JVD; No carotid bruits LYMPHATICS: No lymphadenopathy CARDIAC: RRR, no murmurs, rubs, gallops RESPIRATORY:  Clear to auscultation without rales, wheezing or rhonchi  ABDOMEN: Soft, non-tender,  non-distended MUSCULOSKELETAL:  No edema; No deformity  SKIN: Warm and dry NEUROLOGIC:  Alert and oriented x 3 PSYCHIATRIC:  Normal affect    Signed, Shirlee More,  MD  11/13/2021 10:07 AM    [facility]

## 2021-11-13 ENCOUNTER — Other Ambulatory Visit: Payer: Self-pay

## 2021-11-13 ENCOUNTER — Ambulatory Visit: Payer: HMO | Admitting: Cardiology

## 2021-11-13 ENCOUNTER — Encounter: Payer: Self-pay | Admitting: Cardiology

## 2021-11-13 VITALS — BP 150/68 | HR 78 | Ht 68.0 in | Wt 217.6 lb

## 2021-11-13 DIAGNOSIS — I10 Essential (primary) hypertension: Secondary | ICD-10-CM

## 2021-11-13 DIAGNOSIS — E785 Hyperlipidemia, unspecified: Secondary | ICD-10-CM

## 2021-11-13 DIAGNOSIS — Z9889 Other specified postprocedural states: Secondary | ICD-10-CM | POA: Diagnosis not present

## 2021-11-13 DIAGNOSIS — I6521 Occlusion and stenosis of right carotid artery: Secondary | ICD-10-CM | POA: Diagnosis not present

## 2021-11-13 MED ORDER — ASPIRIN EC 81 MG PO TBEC: 81.0000 mg | DELAYED_RELEASE_TABLET | Freq: Every day | ORAL | 3 refills | Status: AC

## 2021-11-13 NOTE — Patient Instructions (Signed)
Medication Instructions:  Your physician has recommended you make the following change in your medication:  DECREASE: Aspirin 81 mg take one tablet by mouth daily.  *If you need a refill on your cardiac medications before your next appointment, please call your pharmacy*   Lab Work: None If you have labs (blood work) drawn today and your tests are completely normal, you will receive your results only by: MyChart Message (if you have MyChart) OR A paper copy in the mail If you have any lab test that is abnormal or we need to change your treatment, we will call you to review the results.   Testing/Procedures: None   Follow-Up: At [facility] you and your health needs are our priority.  As part of our continuing mission to provide you with exceptional heart care, we have created designated Provider Care Teams.  These Care Teams include your primary Cardiologist (physician) and Advanced Practice Providers (APPs -  Physician Assistants and Nurse Practitioners) who all work together to provide you with the care you need, when you need it.  We recommend signing up for the patient portal called "MyChart".  Sign up information is provided on this After Visit Summary.  MyChart is used to connect with patients for Virtual Visits (Telemedicine).  Patients are able to view lab/test results, encounter notes, upcoming appointments, etc.  Non-urgent messages can be sent to your provider as well.   To learn more about what you can do with MyChart, go to [email or web address].    Your next appointment:   As needed  The format for your next appointment:   In Person  Provider:   Norman Herrlich, MD    Other Instructions

## 2021-12-06 DIAGNOSIS — E1169 Type 2 diabetes mellitus with other specified complication: Secondary | ICD-10-CM | POA: Diagnosis not present

## 2021-12-06 DIAGNOSIS — G720 Drug-induced myopathy: Secondary | ICD-10-CM | POA: Diagnosis not present

## 2021-12-06 DIAGNOSIS — Z0000 Encounter for general adult medical examination without abnormal findings: Secondary | ICD-10-CM | POA: Diagnosis not present

## 2021-12-06 DIAGNOSIS — T466X5A Adverse effect of antihyperlipidemic and antiarteriosclerotic drugs, initial encounter: Secondary | ICD-10-CM | POA: Diagnosis not present

## 2021-12-06 DIAGNOSIS — I771 Stricture of artery: Secondary | ICD-10-CM | POA: Diagnosis not present

## 2021-12-06 DIAGNOSIS — I25118 Atherosclerotic heart disease of native coronary artery with other forms of angina pectoris: Secondary | ICD-10-CM | POA: Diagnosis not present

## 2021-12-06 DIAGNOSIS — I10 Essential (primary) hypertension: Secondary | ICD-10-CM | POA: Diagnosis not present

## 2021-12-06 DIAGNOSIS — E1140 Type 2 diabetes mellitus with diabetic neuropathy, unspecified: Secondary | ICD-10-CM | POA: Diagnosis not present

## 2021-12-06 DIAGNOSIS — I700 Atherosclerosis of aorta: Secondary | ICD-10-CM | POA: Diagnosis not present

## 2021-12-06 DIAGNOSIS — I6523 Occlusion and stenosis of bilateral carotid arteries: Secondary | ICD-10-CM | POA: Diagnosis not present

## 2021-12-06 DIAGNOSIS — E785 Hyperlipidemia, unspecified: Secondary | ICD-10-CM | POA: Diagnosis not present

## 2021-12-06 DIAGNOSIS — M65331 Trigger finger, right middle finger: Secondary | ICD-10-CM | POA: Diagnosis not present

## 2021-12-19 DIAGNOSIS — E1169 Type 2 diabetes mellitus with other specified complication: Secondary | ICD-10-CM | POA: Diagnosis not present

## 2021-12-19 DIAGNOSIS — E785 Hyperlipidemia, unspecified: Secondary | ICD-10-CM | POA: Diagnosis not present

## 2021-12-19 DIAGNOSIS — I10 Essential (primary) hypertension: Secondary | ICD-10-CM | POA: Diagnosis not present

## 2022-02-20 DIAGNOSIS — M25561 Pain in right knee: Secondary | ICD-10-CM | POA: Diagnosis not present

## 2022-03-21 ENCOUNTER — Ambulatory Visit: Payer: HMO | Admitting: Vascular Surgery

## 2022-03-21 ENCOUNTER — Encounter (HOSPITAL_COMMUNITY): Payer: HMO

## 2022-03-28 ENCOUNTER — Ambulatory Visit: Payer: HMO | Admitting: Vascular Surgery

## 2022-03-28 ENCOUNTER — Encounter: Payer: Self-pay | Admitting: Vascular Surgery

## 2022-03-28 ENCOUNTER — Ambulatory Visit (HOSPITAL_COMMUNITY)
Admission: RE | Admit: 2022-03-28 | Discharge: 2022-03-28 | Disposition: A | Discharge status: Home / Self Care | Payer: HMO | Source: Ambulatory Visit | Attending: Vascular Surgery | Admitting: Vascular Surgery

## 2022-03-28 VITALS — BP 135/72 | HR 57 | Temp 98.7°F | Resp 20 | Ht 68.0 in | Wt 214.9 lb

## 2022-03-28 DIAGNOSIS — I6521 Occlusion and stenosis of right carotid artery: Secondary | ICD-10-CM | POA: Insufficient documentation

## 2022-03-28 NOTE — Progress Notes (Signed)
Patient ID: Jermaine Hester, male   DOB: 1930-06-09, 88 y.o.   MRN: [id number]  Reason for Consult: Follow-up   Referred by Jermaine Hester, *  Subjective:     HPI:  Jermaine Hester is a 88 y.o. male status post right carotid endarterectomy for high-grade stenosis that was asymptomatic.  Surgery was complicated by 3 months of hoarseness but he states that now this has completely resolved and he has no issues with swallowing or speech. Overall he has healed well and has no complaints related to today's visit.  Past Medical History:  Diagnosis Date   Arthropathy of right shoulder    Benign essential hypertension    BPH with obstruction/lower urinary tract symptoms    Diabetic neuropathy (HCC)    Dyslipidemia    Dyslipidemia    History of fungal skin infection 2021   Significant over joint   ICAO (internal carotid artery occlusion), bilateral    Noted on recent CTA scans   Idiopathic chronic gout of multiple sites without tophus    Morbid obesity (Jermaine Hester)    Primary localized osteoarthrosis of multiple sites    Psoriasis    Scar tissue 2021   Sleep apnea    Statin myopathy    Type 2 diabetes mellitus with hyperlipidemia ([place])    Family History  Problem Relation Age of Onset   Stroke Mother    Brain cancer Mother    Heart attack Father 62   Stroke Sister 30   Heart attack Brother 57   Heart Problems Brother    Parkinson's disease Brother 82   Past Surgical History:  Procedure Laterality Date   ENDARTERECTOMY Right 05/24/2021   Procedure: RIGHT CAROTID ARTERY ENDARTERECTOMY;  Surgeon: Waynetta Sandy, MD;  Location: [place];  Service: Vascular;  Laterality: Right;   MULTIPLE ORTHOPEDIC PROCEDURES     ORIF FEMUR FRACTURE Right 01/29/2016   S/P FALL.  DR. Lovena Neighbours, Mikel Cella.   PATCH ANGIOPLASTY Right 05/24/2021   Procedure: PATCH ANGIOPLASTY;  Surgeon: Waynetta Sandy, MD;  Location: [facility] OR;  Service: Vascular;  Laterality: Right;   REPLACEMENT TOTAL KNEE  Bilateral    STATUS POST SHOULDER REPLACEMENT Bilateral    STATUS POST SHOULDER REPLACEMENT AND REVISION ROTATOR CUFF SURGERY     TOTAL HIP ARTHROPLASTY Bilateral     Short Social History:  Social History   Tobacco Use   Smoking status: Former    Types: Cigarettes    Passive exposure: Past   Smokeless tobacco: Never  Substance Use Topics   Alcohol use: Never    Allergies  Allergen Reactions   Penicillins Itching and Rash   Sulfa Antibiotics Itching and Rash   Morphine And Related Other (See Comments)    Makes pt feel crazy    Current Outpatient Medications  Medication Sig Dispense Refill   acetaminophen (TYLENOL) 500 MG tablet Take 500 mg by mouth every 6 (six) hours as needed for mild pain.     allopurinol (ZYLOPRIM) 100 MG tablet Take 100 mg by mouth 2 (two) times daily.     amLODipine (NORVASC) 10 MG tablet Take 10 mg by mouth daily.     ascorbic acid (VITAMIN C) 1000 MG tablet Take 1,000 mg by mouth daily.     aspirin EC 81 MG tablet Take 1 tablet (81 mg total) by mouth daily. Swallow whole. 90 tablet 3   B Complex Vitamins (VITAMIN B COMPLEX) TABS Take 1 tablet by mouth daily.     Blood Glucose  Monitoring Suppl (ONE TOUCH ULTRA 2) w/Device KIT See admin instructions.     Calcium Carbonate-Vitamin D 600-125 MG-UNIT TABS Take 1 tablet by mouth daily.     Cholecalciferol 25 MCG (1000 UT) tablet Take 1,000 Units by mouth daily.     Coenzyme Q10 200 MG capsule Take 200 mg by mouth every morning.     enalapril (VASOTEC) 10 MG tablet Take 10 mg by mouth daily.     Gabapentin, Once-Daily, 300 MG TABS Take 300 mg by mouth daily.     HYDROcodone-acetaminophen (NORCO/VICODIN) 5-325 MG tablet Take 1 tablet by mouth every 6 (six) hours as needed for moderate pain. 6 tablet 0   Lancets (ONETOUCH DELICA PLUS [id number]) MISC USE 1 TO CHECK GLUCOSE ONCE DAILY     Magnesium Oxide 420 MG TABS Take 420 mg by mouth every morning.     metFORMIN (GLUCOPHAGE) 500 MG tablet Take 500-1,000 mg  by mouth See admin instructions. 1000 mg every am and 500 mg evening     Omega-3 Fatty Acids (FISH OIL) 500 MG CAPS Take 500 mg by mouth daily.     ondansetron (ZOFRAN) 4 MG tablet Take 1 tablet (4 mg total) by mouth daily as needed for nausea or vomiting. 10 tablet 1   ONETOUCH ULTRA test strip USE 1 STRIP TO CHECK GLUCOSE ONCE DAILY     pioglitazone (ACTOS) 30 MG tablet Take 30 mg by mouth daily.     promethazine (PHENERGAN) 12.5 MG tablet Take 1 tablet (12.5 mg total) by mouth every 6 (six) hours as needed for nausea or vomiting. 15 tablet 0   rosuvastatin (CRESTOR) 20 MG tablet Take 1 tablet (20 mg total) by mouth daily.     tamsulosin (FLOMAX) 0.4 MG CAPS capsule Take 0.4 mg by mouth daily.     No current facility-administered medications for this visit.    Review of Systems  Constitutional:  Constitutional negative. HENT: HENT negative.       Hoarseness has resolved Eyes: Eyes negative.  Respiratory: Respiratory negative.  Cardiovascular: Cardiovascular negative.  GI: Gastrointestinal negative.  Skin: Skin negative.  Neurological: Neurological negative. Hematologic: Hematologic/lymphatic negative.  Psychiatric: Psychiatric negative.       Objective:  Objective   Vitals:   03/28/22 1134 03/28/22 1136  BP: 137/72 135/72  Pulse: (!) 57   Resp: 20   Temp: 98.7 F (37.1 C)   SpO2: 94%   Weight: 214 lb 14.4 oz (97.5 kg)   Height: _0  (1.727 m)    Body mass index is 32.68 kg/m.  Physical Exam HENT:     Head: Normocephalic.  Eyes:     Pupils: Pupils are equal, round, and reactive to light.  Neck:     Vascular: No carotid bruit.     Comments: Well-healed right neck incision Cardiovascular:     Rate and Rhythm: Normal rate.  Abdominal:     General: Abdomen is flat.     Palpations: Abdomen is soft.  Musculoskeletal:        General: No swelling. Normal range of motion.  Skin:    General: Skin is warm and dry.  Neurological:     General: No focal deficit  present.     Mental Status: He is alert.     Cranial Nerves: No cranial nerve deficit.    Data: Right Carotid Findings:  +----------+--------+--------+--------+------------------+--------+            PSV cm/sEDV cm/sStenosisPlaque DescriptionComments  +----------+--------+--------+--------+------------------+--------+  CCA Prox  81  11                                tortuous  +----------+--------+--------+--------+------------------+--------+  CCA Mid   109     12                                          +----------+--------+--------+--------+------------------+--------+  CCA Distal75      10              heterogenous                +----------+--------+--------+--------+------------------+--------+  ICA Prox  89      20      Normal                              +----------+--------+--------+--------+------------------+--------+  ICA Mid   97      18                                          +----------+--------+--------+--------+------------------+--------+  ICA Distal83      16                                          +----------+--------+--------+--------+------------------+--------+  ECA       183                     heterogenous                +----------+--------+--------+--------+------------------+--------+   +----------+--------+-------+----------------+-------------------+            PSV cm/sEDV cmsDescribe        Arm Pressure (mmHG)  +----------+--------+-------+----------------+-------------------+  Subclavian201            Multiphasic, [id number]                  +----------+--------+-------+----------------+-------------------+   +---------+--------+--------+--------------+  VertebralPSV cm/sEDV cm/sNot identified  +---------+--------+--------+--------------+       Left Carotid Findings:  +----------+--------+--------+--------+-------------------------+---------+             PSV cm/sEDV  cm/sStenosisPlaque Description       Comments    +----------+--------+--------+--------+-------------------------+---------+   CCA Prox  118     17                                                   +----------+--------+--------+--------+-------------------------+---------+   CCA Mid   114     14              heterogenous                         +----------+--------+--------+--------+-------------------------+---------+   CCA Distal115     15              heterogenous and calcific            +----------+--------+--------+--------+-------------------------+---------+   ICA Prox  107     19  1-39%   calcific                  Shadowing  +----------+--------+--------+--------+-------------------------+---------+   ICA Mid   74      16                                       tortuous    +----------+--------+--------+--------+-------------------------+---------+   ICA Distal63      16                                                   +----------+--------+--------+--------+-------------------------+---------+   ECA       119     4               calcific                             +----------+--------+--------+--------+-------------------------+---------+    +----------+--------+--------+----------------+-------------------+            PSV cm/sEDV cm/sDescribe        Arm Pressure (mmHG)  +----------+--------+--------+----------------+-------------------+  [id number]             Multiphasic, [id number]                  +----------+--------+--------+----------------+-------------------+   +---------+--------+--+--------+-+---------+  VertebralPSV cm/s22EDV cm/s4Antegrade  +---------+--------+--+--------+-+---------+       Summary:  Right Carotid: Patent endarterectomy site with no evidence of restenosis.   Left Carotid: Velocities in the left ICA are consistent with a 1-39%  stenosis.   Vertebrals:  Left  vertebral artery demonstrates antegrade flow. Right  vertebral               artery was not visualized.  Subclavians: Normal flow hemodynamics were seen in bilateral subclavian               arteries.      Assessment/Plan:    88 year old male status post right carotid endarterectomy that is patent without any evidence of stenosis and minimal stenosis on the left.  We will continue yearly surveillance unless he has issues prior to that.     Waynetta Sandy MD [facility]

## 2022-04-11 DIAGNOSIS — J209 Acute bronchitis, unspecified: Secondary | ICD-10-CM | POA: Diagnosis not present

## 2022-04-11 DIAGNOSIS — R070 Pain in throat: Secondary | ICD-10-CM | POA: Diagnosis not present

## 2022-04-11 DIAGNOSIS — R051 Acute cough: Secondary | ICD-10-CM | POA: Diagnosis not present

## 2022-04-30 DIAGNOSIS — Z125 Encounter for screening for malignant neoplasm of prostate: Secondary | ICD-10-CM | POA: Diagnosis not present

## 2022-04-30 DIAGNOSIS — N401 Enlarged prostate with lower urinary tract symptoms: Secondary | ICD-10-CM | POA: Diagnosis not present

## 2022-04-30 DIAGNOSIS — R351 Nocturia: Secondary | ICD-10-CM | POA: Diagnosis not present

## 2022-06-29 DIAGNOSIS — M5441 Lumbago with sciatica, right side: Secondary | ICD-10-CM | POA: Diagnosis not present

## 2022-06-29 DIAGNOSIS — M9901 Segmental and somatic dysfunction of cervical region: Secondary | ICD-10-CM | POA: Diagnosis not present

## 2022-06-29 DIAGNOSIS — M9902 Segmental and somatic dysfunction of thoracic region: Secondary | ICD-10-CM | POA: Diagnosis not present

## 2022-06-29 DIAGNOSIS — M9903 Segmental and somatic dysfunction of lumbar region: Secondary | ICD-10-CM | POA: Diagnosis not present

## 2022-06-29 DIAGNOSIS — M50321 Other cervical disc degeneration at C4-C5 level: Secondary | ICD-10-CM | POA: Diagnosis not present

## 2022-06-29 DIAGNOSIS — M4726 Other spondylosis with radiculopathy, lumbar region: Secondary | ICD-10-CM | POA: Diagnosis not present

## 2022-06-29 DIAGNOSIS — M9905 Segmental and somatic dysfunction of pelvic region: Secondary | ICD-10-CM | POA: Diagnosis not present

## 2022-07-03 DIAGNOSIS — M5441 Lumbago with sciatica, right side: Secondary | ICD-10-CM | POA: Diagnosis not present

## 2022-07-03 DIAGNOSIS — M9901 Segmental and somatic dysfunction of cervical region: Secondary | ICD-10-CM | POA: Diagnosis not present

## 2022-07-03 DIAGNOSIS — M50321 Other cervical disc degeneration at C4-C5 level: Secondary | ICD-10-CM | POA: Diagnosis not present

## 2022-07-03 DIAGNOSIS — M9902 Segmental and somatic dysfunction of thoracic region: Secondary | ICD-10-CM | POA: Diagnosis not present

## 2022-07-03 DIAGNOSIS — M9903 Segmental and somatic dysfunction of lumbar region: Secondary | ICD-10-CM | POA: Diagnosis not present

## 2022-07-03 DIAGNOSIS — M9905 Segmental and somatic dysfunction of pelvic region: Secondary | ICD-10-CM | POA: Diagnosis not present

## 2022-07-03 DIAGNOSIS — M4726 Other spondylosis with radiculopathy, lumbar region: Secondary | ICD-10-CM | POA: Diagnosis not present

## 2022-07-12 DIAGNOSIS — M9905 Segmental and somatic dysfunction of pelvic region: Secondary | ICD-10-CM | POA: Diagnosis not present

## 2022-07-12 DIAGNOSIS — M50321 Other cervical disc degeneration at C4-C5 level: Secondary | ICD-10-CM | POA: Diagnosis not present

## 2022-07-12 DIAGNOSIS — M5441 Lumbago with sciatica, right side: Secondary | ICD-10-CM | POA: Diagnosis not present

## 2022-07-12 DIAGNOSIS — M9901 Segmental and somatic dysfunction of cervical region: Secondary | ICD-10-CM | POA: Diagnosis not present

## 2022-07-12 DIAGNOSIS — M9903 Segmental and somatic dysfunction of lumbar region: Secondary | ICD-10-CM | POA: Diagnosis not present

## 2022-07-12 DIAGNOSIS — M4726 Other spondylosis with radiculopathy, lumbar region: Secondary | ICD-10-CM | POA: Diagnosis not present

## 2022-07-12 DIAGNOSIS — M9902 Segmental and somatic dysfunction of thoracic region: Secondary | ICD-10-CM | POA: Diagnosis not present

## 2022-07-18 DIAGNOSIS — M5441 Lumbago with sciatica, right side: Secondary | ICD-10-CM | POA: Diagnosis not present

## 2022-07-18 DIAGNOSIS — M9902 Segmental and somatic dysfunction of thoracic region: Secondary | ICD-10-CM | POA: Diagnosis not present

## 2022-07-18 DIAGNOSIS — M9901 Segmental and somatic dysfunction of cervical region: Secondary | ICD-10-CM | POA: Diagnosis not present

## 2022-07-18 DIAGNOSIS — M4726 Other spondylosis with radiculopathy, lumbar region: Secondary | ICD-10-CM | POA: Diagnosis not present

## 2022-07-18 DIAGNOSIS — M9905 Segmental and somatic dysfunction of pelvic region: Secondary | ICD-10-CM | POA: Diagnosis not present

## 2022-07-18 DIAGNOSIS — M9903 Segmental and somatic dysfunction of lumbar region: Secondary | ICD-10-CM | POA: Diagnosis not present

## 2022-07-18 DIAGNOSIS — M50321 Other cervical disc degeneration at C4-C5 level: Secondary | ICD-10-CM | POA: Diagnosis not present

## 2022-07-27 DIAGNOSIS — Z23 Encounter for immunization: Secondary | ICD-10-CM | POA: Diagnosis not present

## 2022-11-01 DIAGNOSIS — R351 Nocturia: Secondary | ICD-10-CM | POA: Diagnosis not present

## 2022-11-01 DIAGNOSIS — N401 Enlarged prostate with lower urinary tract symptoms: Secondary | ICD-10-CM | POA: Diagnosis not present

## 2022-11-08 DIAGNOSIS — M4726 Other spondylosis with radiculopathy, lumbar region: Secondary | ICD-10-CM | POA: Diagnosis not present

## 2022-11-08 DIAGNOSIS — M9901 Segmental and somatic dysfunction of cervical region: Secondary | ICD-10-CM | POA: Diagnosis not present

## 2022-11-08 DIAGNOSIS — M9905 Segmental and somatic dysfunction of pelvic region: Secondary | ICD-10-CM | POA: Diagnosis not present

## 2022-11-08 DIAGNOSIS — M9902 Segmental and somatic dysfunction of thoracic region: Secondary | ICD-10-CM | POA: Diagnosis not present

## 2022-11-08 DIAGNOSIS — M5441 Lumbago with sciatica, right side: Secondary | ICD-10-CM | POA: Diagnosis not present

## 2022-11-08 DIAGNOSIS — M50321 Other cervical disc degeneration at C4-C5 level: Secondary | ICD-10-CM | POA: Diagnosis not present

## 2022-11-08 DIAGNOSIS — M9903 Segmental and somatic dysfunction of lumbar region: Secondary | ICD-10-CM | POA: Diagnosis not present

## 2022-11-29 DIAGNOSIS — M50321 Other cervical disc degeneration at C4-C5 level: Secondary | ICD-10-CM | POA: Diagnosis not present

## 2022-11-29 DIAGNOSIS — M9902 Segmental and somatic dysfunction of thoracic region: Secondary | ICD-10-CM | POA: Diagnosis not present

## 2022-11-29 DIAGNOSIS — M9903 Segmental and somatic dysfunction of lumbar region: Secondary | ICD-10-CM | POA: Diagnosis not present

## 2022-11-29 DIAGNOSIS — M9905 Segmental and somatic dysfunction of pelvic region: Secondary | ICD-10-CM | POA: Diagnosis not present

## 2022-11-29 DIAGNOSIS — M9901 Segmental and somatic dysfunction of cervical region: Secondary | ICD-10-CM | POA: Diagnosis not present

## 2022-11-29 DIAGNOSIS — M4726 Other spondylosis with radiculopathy, lumbar region: Secondary | ICD-10-CM | POA: Diagnosis not present

## 2022-11-29 DIAGNOSIS — M5441 Lumbago with sciatica, right side: Secondary | ICD-10-CM | POA: Diagnosis not present

## 2022-12-20 DIAGNOSIS — G63 Polyneuropathy in diseases classified elsewhere: Secondary | ICD-10-CM | POA: Diagnosis not present

## 2022-12-20 DIAGNOSIS — G720 Drug-induced myopathy: Secondary | ICD-10-CM | POA: Diagnosis not present

## 2022-12-20 DIAGNOSIS — M4726 Other spondylosis with radiculopathy, lumbar region: Secondary | ICD-10-CM | POA: Diagnosis not present

## 2022-12-20 DIAGNOSIS — I6523 Occlusion and stenosis of bilateral carotid arteries: Secondary | ICD-10-CM | POA: Diagnosis not present

## 2022-12-20 DIAGNOSIS — E1140 Type 2 diabetes mellitus with diabetic neuropathy, unspecified: Secondary | ICD-10-CM | POA: Diagnosis not present

## 2022-12-20 DIAGNOSIS — M5441 Lumbago with sciatica, right side: Secondary | ICD-10-CM | POA: Diagnosis not present

## 2022-12-20 DIAGNOSIS — M9902 Segmental and somatic dysfunction of thoracic region: Secondary | ICD-10-CM | POA: Diagnosis not present

## 2022-12-20 DIAGNOSIS — I771 Stricture of artery: Secondary | ICD-10-CM | POA: Diagnosis not present

## 2022-12-20 DIAGNOSIS — E1169 Type 2 diabetes mellitus with other specified complication: Secondary | ICD-10-CM | POA: Diagnosis not present

## 2022-12-20 DIAGNOSIS — I25118 Atherosclerotic heart disease of native coronary artery with other forms of angina pectoris: Secondary | ICD-10-CM | POA: Diagnosis not present

## 2022-12-20 DIAGNOSIS — Z0000 Encounter for general adult medical examination without abnormal findings: Secondary | ICD-10-CM | POA: Diagnosis not present

## 2022-12-20 DIAGNOSIS — T466X5A Adverse effect of antihyperlipidemic and antiarteriosclerotic drugs, initial encounter: Secondary | ICD-10-CM | POA: Diagnosis not present

## 2022-12-20 DIAGNOSIS — E785 Hyperlipidemia, unspecified: Secondary | ICD-10-CM | POA: Diagnosis not present

## 2022-12-20 DIAGNOSIS — I700 Atherosclerosis of aorta: Secondary | ICD-10-CM | POA: Diagnosis not present

## 2022-12-20 DIAGNOSIS — M9905 Segmental and somatic dysfunction of pelvic region: Secondary | ICD-10-CM | POA: Diagnosis not present

## 2022-12-20 DIAGNOSIS — I6521 Occlusion and stenosis of right carotid artery: Secondary | ICD-10-CM | POA: Diagnosis not present

## 2022-12-20 DIAGNOSIS — M9903 Segmental and somatic dysfunction of lumbar region: Secondary | ICD-10-CM | POA: Diagnosis not present

## 2022-12-20 DIAGNOSIS — M50321 Other cervical disc degeneration at C4-C5 level: Secondary | ICD-10-CM | POA: Diagnosis not present

## 2022-12-20 DIAGNOSIS — M9901 Segmental and somatic dysfunction of cervical region: Secondary | ICD-10-CM | POA: Diagnosis not present

## 2023-01-10 DIAGNOSIS — M9902 Segmental and somatic dysfunction of thoracic region: Secondary | ICD-10-CM | POA: Diagnosis not present

## 2023-01-10 DIAGNOSIS — M5441 Lumbago with sciatica, right side: Secondary | ICD-10-CM | POA: Diagnosis not present

## 2023-01-10 DIAGNOSIS — M9901 Segmental and somatic dysfunction of cervical region: Secondary | ICD-10-CM | POA: Diagnosis not present

## 2023-01-10 DIAGNOSIS — M9903 Segmental and somatic dysfunction of lumbar region: Secondary | ICD-10-CM | POA: Diagnosis not present

## 2023-01-10 DIAGNOSIS — M4726 Other spondylosis with radiculopathy, lumbar region: Secondary | ICD-10-CM | POA: Diagnosis not present

## 2023-01-10 DIAGNOSIS — M50321 Other cervical disc degeneration at C4-C5 level: Secondary | ICD-10-CM | POA: Diagnosis not present

## 2023-01-10 DIAGNOSIS — M9905 Segmental and somatic dysfunction of pelvic region: Secondary | ICD-10-CM | POA: Diagnosis not present

## 2023-01-31 DIAGNOSIS — M50321 Other cervical disc degeneration at C4-C5 level: Secondary | ICD-10-CM | POA: Diagnosis not present

## 2023-01-31 DIAGNOSIS — M5441 Lumbago with sciatica, right side: Secondary | ICD-10-CM | POA: Diagnosis not present

## 2023-01-31 DIAGNOSIS — M9901 Segmental and somatic dysfunction of cervical region: Secondary | ICD-10-CM | POA: Diagnosis not present

## 2023-01-31 DIAGNOSIS — M4726 Other spondylosis with radiculopathy, lumbar region: Secondary | ICD-10-CM | POA: Diagnosis not present

## 2023-01-31 DIAGNOSIS — M9903 Segmental and somatic dysfunction of lumbar region: Secondary | ICD-10-CM | POA: Diagnosis not present

## 2023-01-31 DIAGNOSIS — M9902 Segmental and somatic dysfunction of thoracic region: Secondary | ICD-10-CM | POA: Diagnosis not present

## 2023-01-31 DIAGNOSIS — M9905 Segmental and somatic dysfunction of pelvic region: Secondary | ICD-10-CM | POA: Diagnosis not present

## 2023-02-28 DIAGNOSIS — M9901 Segmental and somatic dysfunction of cervical region: Secondary | ICD-10-CM | POA: Diagnosis not present

## 2023-02-28 DIAGNOSIS — M9905 Segmental and somatic dysfunction of pelvic region: Secondary | ICD-10-CM | POA: Diagnosis not present

## 2023-02-28 DIAGNOSIS — M9903 Segmental and somatic dysfunction of lumbar region: Secondary | ICD-10-CM | POA: Diagnosis not present

## 2023-02-28 DIAGNOSIS — M5441 Lumbago with sciatica, right side: Secondary | ICD-10-CM | POA: Diagnosis not present

## 2023-02-28 DIAGNOSIS — M50321 Other cervical disc degeneration at C4-C5 level: Secondary | ICD-10-CM | POA: Diagnosis not present

## 2023-02-28 DIAGNOSIS — M9902 Segmental and somatic dysfunction of thoracic region: Secondary | ICD-10-CM | POA: Diagnosis not present

## 2023-02-28 DIAGNOSIS — M4726 Other spondylosis with radiculopathy, lumbar region: Secondary | ICD-10-CM | POA: Diagnosis not present

## 2023-03-28 DIAGNOSIS — M5441 Lumbago with sciatica, right side: Secondary | ICD-10-CM | POA: Diagnosis not present

## 2023-03-28 DIAGNOSIS — M4726 Other spondylosis with radiculopathy, lumbar region: Secondary | ICD-10-CM | POA: Diagnosis not present

## 2023-03-28 DIAGNOSIS — M9902 Segmental and somatic dysfunction of thoracic region: Secondary | ICD-10-CM | POA: Diagnosis not present

## 2023-03-28 DIAGNOSIS — M9903 Segmental and somatic dysfunction of lumbar region: Secondary | ICD-10-CM | POA: Diagnosis not present

## 2023-03-28 DIAGNOSIS — M9905 Segmental and somatic dysfunction of pelvic region: Secondary | ICD-10-CM | POA: Diagnosis not present

## 2023-03-28 DIAGNOSIS — M50321 Other cervical disc degeneration at C4-C5 level: Secondary | ICD-10-CM | POA: Diagnosis not present

## 2023-03-28 DIAGNOSIS — M9901 Segmental and somatic dysfunction of cervical region: Secondary | ICD-10-CM | POA: Diagnosis not present

## 2023-04-02 ENCOUNTER — Other Ambulatory Visit: Payer: Self-pay | Admitting: *Deleted

## 2023-04-02 DIAGNOSIS — I6521 Occlusion and stenosis of right carotid artery: Secondary | ICD-10-CM

## 2023-04-03 NOTE — Progress Notes (Signed)
HISTORY AND PHYSICAL     CC:  follow up. Requesting Provider:  Street, Stephanie Coup, *  HPI: This is a 89 y.o. male here for follow up for carotid artery stenosis.  Pt is s/p right CEA for asymptomatic carotid artery stenosis on 05/24/2021 by Dr. Randie Heinz.  Surgery was complicated by 3 months of hoarseness but completely resolved.   Pt was last seen 03/28/2022 and at that time pt was doing well without any neurological sx.   Pt returns today for follow up.    Pt denies any amaurosis fugax, speech difficulties, weakness, numbness, paralysis or clumsiness or facial droop.    He states that he does get some cramping in his legs when he walks but is mostly limited by shortness of breath.  He goes to the [facility] in [place] every 3 months to have his toe nails trimmed as he does have neuropathy in his feet. He denies any non healing wounds or rest pain.   He continues not to smoke.    The pt is on a statin for cholesterol management.  The pt is on a daily aspirin.   Other AC:  none The pt is on CCB, ACEI for hypertension.   The pt is  on medication for diabetes Tobacco hx:  former  Pt does not have family hx of AAA.  He was in the [employer] in the 50's.  Past Medical History:  Diagnosis Date   Arthropathy of right shoulder    Benign essential hypertension    BPH with obstruction/lower urinary tract symptoms    Diabetic neuropathy (HCC)    Dyslipidemia    Dyslipidemia    History of fungal skin infection 2021   Significant over joint   ICAO (internal carotid artery occlusion), bilateral    Noted on recent CTA scans   Idiopathic chronic gout of multiple sites without tophus    Morbid obesity (HCC)    Primary localized osteoarthrosis of multiple sites    Psoriasis    Scar tissue 2021   Sleep apnea    Statin myopathy    Type 2 diabetes mellitus with hyperlipidemia [facility])     Past Surgical History:  Procedure Laterality Date   ENDARTERECTOMY Right 05/24/2021   Procedure: RIGHT CAROTID  ARTERY ENDARTERECTOMY;  Surgeon: Maeola Harman, MD;  Location: [facility] OR;  Service: Vascular;  Laterality: Right;   MULTIPLE ORTHOPEDIC PROCEDURES     ORIF FEMUR FRACTURE Right 01/29/2016   S/P FALL.  DR. Liliane Shi, Berton Lan.   PATCH ANGIOPLASTY Right 05/24/2021   Procedure: PATCH ANGIOPLASTY;  Surgeon: Maeola Harman, MD;  Location: [facility] OR;  Service: Vascular;  Laterality: Right;   REPLACEMENT TOTAL KNEE Bilateral    STATUS POST SHOULDER REPLACEMENT Bilateral    STATUS POST SHOULDER REPLACEMENT AND REVISION ROTATOR CUFF SURGERY     TOTAL HIP ARTHROPLASTY Bilateral     Allergies  Allergen Reactions   Penicillins Itching and Rash   Sulfa Antibiotics Itching and Rash   Morphine And Codeine Other (See Comments)    Makes pt feel crazy    Current Outpatient Medications  Medication Sig Dispense Refill   acetaminophen (TYLENOL) 500 MG tablet Take 500 mg by mouth every 6 (six) hours as needed for mild pain.     allopurinol (ZYLOPRIM) 100 MG tablet Take 100 mg by mouth 2 (two) times daily.     amLODipine (NORVASC) 10 MG tablet Take 10 mg by mouth daily.     ascorbic acid (VITAMIN C) 1000 MG tablet  Take 1,000 mg by mouth daily.     aspirin EC 81 MG tablet Take 1 tablet (81 mg total) by mouth daily. Swallow whole. 90 tablet 3   B Complex Vitamins (VITAMIN B COMPLEX) TABS Take 1 tablet by mouth daily.     Blood Glucose Monitoring Suppl (ONE TOUCH ULTRA 2) w/Device KIT See admin instructions.     Calcium Carbonate-Vitamin D 600-125 MG-UNIT TABS Take 1 tablet by mouth daily.     Cholecalciferol 25 MCG (1000 UT) tablet Take 1,000 Units by mouth daily.     Coenzyme Q10 200 MG capsule Take 200 mg by mouth every morning.     enalapril (VASOTEC) 10 MG tablet Take 10 mg by mouth daily.     Gabapentin, Once-Daily, 300 MG TABS Take 300 mg by mouth daily.     Lancets (ONETOUCH DELICA PLUS LANCET33G) MISC USE 1 TO CHECK GLUCOSE ONCE DAILY     Magnesium Oxide 420 MG TABS Take 420 mg by mouth  every morning.     metFORMIN (GLUCOPHAGE) 500 MG tablet Take 500-1,000 mg by mouth See admin instructions. 1000 mg every am and 500 mg evening     Omega-3 Fatty Acids (FISH OIL) 500 MG CAPS Take 500 mg by mouth daily.     ONETOUCH ULTRA test strip USE 1 STRIP TO CHECK GLUCOSE ONCE DAILY     pioglitazone (ACTOS) 30 MG tablet Take 30 mg by mouth daily.     promethazine (PHENERGAN) 12.5 MG tablet Take 1 tablet (12.5 mg total) by mouth every 6 (six) hours as needed for nausea or vomiting. 15 tablet 0   rosuvastatin (CRESTOR) 20 MG tablet Take 1 tablet (20 mg total) by mouth daily.     tamsulosin (FLOMAX) 0.4 MG CAPS capsule Take 0.4 mg by mouth daily.     No current facility-administered medications for this visit.    Family History  Problem Relation Age of Onset   Stroke Mother    Brain cancer Mother    Heart attack Father 31   Stroke Sister 59   Heart attack Brother 23   Heart Problems Brother    Parkinson's disease Brother 83    Social History   Socioeconomic History   Marital status: Married    Spouse name: Not on file   Number of children: Not on file   Years of education: Not on file   Highest education level: Not on file  Occupational History   Not on file  Tobacco Use   Smoking status: Former    Types: Cigarettes    Passive exposure: Past   Smokeless tobacco: Never  Vaping Use   Vaping Use: Never used  Substance and Sexual Activity   Alcohol use: Never   Drug use: Never   Sexual activity: Not on file  Other Topics Concern   Not on file  Social History Narrative   Not on file   Social Determinants of Health   Financial Resource Strain: Not on file  Food Insecurity: Not on file  Transportation Needs: Not on file  Physical Activity: Not on file  Stress: Not on file  Social Connections: Not on file  Intimate Partner Violence: Not on file     REVIEW OF SYSTEMS:   [X]  denotes positive finding, [ ]  denotes negative finding Cardiac  Comments:  Chest pain  or chest pressure:    Shortness of breath upon exertion: x   Short of breath when lying flat:    Irregular heart rhythm:  Vascular    Pain in calf, thigh, or hip brought on by ambulation:    Pain in feet at night that wakes you up from your sleep:     Blood clot in your veins:    Leg swelling:         Pulmonary    Oxygen at home:    Productive cough:     Wheezing:         Neurologic    Sudden weakness in arms or legs:     Sudden numbness in arms or legs:     Sudden onset of difficulty speaking or slurred speech:    Temporary loss of vision in one eye:     Problems with dizziness:         Gastrointestinal    Blood in stool:     Vomited blood:         Genitourinary    Burning when urinating:     Blood in urine:        Psychiatric    Major depression:         Hematologic    Bleeding problems:    Problems with blood clotting too easily:        Skin    Rashes or ulcers:        Constitutional    Fever or chills:      PHYSICAL EXAMINATION:  Today's Vitals   04/10/23 0942 04/10/23 0943  BP: (!) 171/69 (!) 155/79  Pulse: 70   Temp: 97.9 F (36.6 C)   TempSrc: Temporal   SpO2: 94%   Weight: 220 lb (99.8 kg)    Body mass index is 33.45 kg/m.   General:  WDWN in NAD; vital signs documented above Gait: Not observed HENT: WNL, normocephalic Pulmonary: normal non-labored breathing Cardiac: regular HR, without carotid bruits Abdomen: soft, NT; aortic pulse is not palpable Skin: without rashes Vascular Exam/Pulses:  Right Left  Radial 2+ (normal) 2+ (normal)  DP Brisk biphasic doppler Brisk biphasic doppler   Extremities: without open wounds Musculoskeletal: no muscle wasting or atrophy  Neurologic: A&O X 3; moving all extremities equally; speech is fluent/normal Psychiatric:  The pt has Normal affect.   Non-Invasive Vascular Imaging:   Carotid Duplex on 04/10/2023 Right: normal Left:  1-39% ICA stenosis Vertebrals:  Bilateral vertebral arteries  demonstrate antegrade flow.  Subclavians: Normal flow hemodynamics were seen in bilateral subclavian arteries.   Previous Carotid duplex on 03/28/2022: Right: normal Left:   1-39% ICA stenosis    ASSESSMENT/PLAN:: 89 y.o. male here for follow up carotid artery stenosis and s/p right CEA for asymptomatic carotid artery stenosis on 05/24/2021 by Dr. Randie Heinz.  Surgery was complicated by 3 months of hoarseness but completely resolved.   -duplex today reveals the right remains normal appearing and the left ICA has 1-39% stenosis.  He remains asymptomatic.   -discussed s/s of stroke with pt and he understands should he develop any of these sx, he will go to the nearest ER or call 911. -pt will f/u in one year with carotid duplex -pt will call sooner should he have any issues. -continue statin/asa    Doreatha Massed, [facility] [phone]  Clinic MD:  Edilia Bo

## 2023-04-10 ENCOUNTER — Ambulatory Visit (HOSPITAL_COMMUNITY)
Admission: RE | Admit: 2023-04-10 | Discharge: 2023-04-10 | Disposition: A | Discharge status: Home / Self Care | Payer: HMO | Source: Ambulatory Visit | Attending: Vascular Surgery | Admitting: Vascular Surgery

## 2023-04-10 ENCOUNTER — Encounter: Payer: Self-pay | Admitting: Physician Assistant

## 2023-04-10 ENCOUNTER — Ambulatory Visit: Payer: HMO | Admitting: Physician Assistant

## 2023-04-10 VITALS — BP 155/79 | HR 70 | Temp 97.9°F | Wt 220.0 lb

## 2023-04-10 DIAGNOSIS — I6529 Occlusion and stenosis of unspecified carotid artery: Secondary | ICD-10-CM | POA: Diagnosis not present

## 2023-04-10 DIAGNOSIS — I6521 Occlusion and stenosis of right carotid artery: Secondary | ICD-10-CM | POA: Insufficient documentation

## 2023-04-20 ENCOUNTER — Other Ambulatory Visit: Payer: Self-pay

## 2023-04-20 DIAGNOSIS — I6529 Occlusion and stenosis of unspecified carotid artery: Secondary | ICD-10-CM

## 2023-04-23 DIAGNOSIS — M4726 Other spondylosis with radiculopathy, lumbar region: Secondary | ICD-10-CM | POA: Diagnosis not present

## 2023-04-23 DIAGNOSIS — M9905 Segmental and somatic dysfunction of pelvic region: Secondary | ICD-10-CM | POA: Diagnosis not present

## 2023-04-23 DIAGNOSIS — M9903 Segmental and somatic dysfunction of lumbar region: Secondary | ICD-10-CM | POA: Diagnosis not present

## 2023-04-23 DIAGNOSIS — M9902 Segmental and somatic dysfunction of thoracic region: Secondary | ICD-10-CM | POA: Diagnosis not present

## 2023-04-23 DIAGNOSIS — M50321 Other cervical disc degeneration at C4-C5 level: Secondary | ICD-10-CM | POA: Diagnosis not present

## 2023-04-23 DIAGNOSIS — M5441 Lumbago with sciatica, right side: Secondary | ICD-10-CM | POA: Diagnosis not present

## 2023-04-23 DIAGNOSIS — M9901 Segmental and somatic dysfunction of cervical region: Secondary | ICD-10-CM | POA: Diagnosis not present

## 2023-05-21 DIAGNOSIS — M9902 Segmental and somatic dysfunction of thoracic region: Secondary | ICD-10-CM | POA: Diagnosis not present

## 2023-05-21 DIAGNOSIS — M9905 Segmental and somatic dysfunction of pelvic region: Secondary | ICD-10-CM | POA: Diagnosis not present

## 2023-05-21 DIAGNOSIS — M50321 Other cervical disc degeneration at C4-C5 level: Secondary | ICD-10-CM | POA: Diagnosis not present

## 2023-05-21 DIAGNOSIS — M4726 Other spondylosis with radiculopathy, lumbar region: Secondary | ICD-10-CM | POA: Diagnosis not present

## 2023-05-21 DIAGNOSIS — M5441 Lumbago with sciatica, right side: Secondary | ICD-10-CM | POA: Diagnosis not present

## 2023-05-21 DIAGNOSIS — M9901 Segmental and somatic dysfunction of cervical region: Secondary | ICD-10-CM | POA: Diagnosis not present

## 2023-05-21 DIAGNOSIS — M9903 Segmental and somatic dysfunction of lumbar region: Secondary | ICD-10-CM | POA: Diagnosis not present

## 2023-06-17 DIAGNOSIS — N401 Enlarged prostate with lower urinary tract symptoms: Secondary | ICD-10-CM | POA: Diagnosis not present

## 2023-06-17 DIAGNOSIS — Z125 Encounter for screening for malignant neoplasm of prostate: Secondary | ICD-10-CM | POA: Diagnosis not present

## 2023-06-17 DIAGNOSIS — R351 Nocturia: Secondary | ICD-10-CM | POA: Diagnosis not present

## 2023-06-18 DIAGNOSIS — M9901 Segmental and somatic dysfunction of cervical region: Secondary | ICD-10-CM | POA: Diagnosis not present

## 2023-06-18 DIAGNOSIS — M9905 Segmental and somatic dysfunction of pelvic region: Secondary | ICD-10-CM | POA: Diagnosis not present

## 2023-06-18 DIAGNOSIS — M4726 Other spondylosis with radiculopathy, lumbar region: Secondary | ICD-10-CM | POA: Diagnosis not present

## 2023-06-18 DIAGNOSIS — M9903 Segmental and somatic dysfunction of lumbar region: Secondary | ICD-10-CM | POA: Diagnosis not present

## 2023-06-18 DIAGNOSIS — M50321 Other cervical disc degeneration at C4-C5 level: Secondary | ICD-10-CM | POA: Diagnosis not present

## 2023-06-18 DIAGNOSIS — M5441 Lumbago with sciatica, right side: Secondary | ICD-10-CM | POA: Diagnosis not present

## 2023-06-18 DIAGNOSIS — M9902 Segmental and somatic dysfunction of thoracic region: Secondary | ICD-10-CM | POA: Diagnosis not present

## 2023-07-16 DIAGNOSIS — M9902 Segmental and somatic dysfunction of thoracic region: Secondary | ICD-10-CM | POA: Diagnosis not present

## 2023-07-16 DIAGNOSIS — M9901 Segmental and somatic dysfunction of cervical region: Secondary | ICD-10-CM | POA: Diagnosis not present

## 2023-07-16 DIAGNOSIS — M4726 Other spondylosis with radiculopathy, lumbar region: Secondary | ICD-10-CM | POA: Diagnosis not present

## 2023-07-16 DIAGNOSIS — M9905 Segmental and somatic dysfunction of pelvic region: Secondary | ICD-10-CM | POA: Diagnosis not present

## 2023-07-16 DIAGNOSIS — M9903 Segmental and somatic dysfunction of lumbar region: Secondary | ICD-10-CM | POA: Diagnosis not present

## 2023-07-16 DIAGNOSIS — M5441 Lumbago with sciatica, right side: Secondary | ICD-10-CM | POA: Diagnosis not present

## 2023-07-16 DIAGNOSIS — M50321 Other cervical disc degeneration at C4-C5 level: Secondary | ICD-10-CM | POA: Diagnosis not present

## 2023-08-07 DIAGNOSIS — Z23 Encounter for immunization: Secondary | ICD-10-CM | POA: Diagnosis not present

## 2023-11-28 DIAGNOSIS — T466X5A Adverse effect of antihyperlipidemic and antiarteriosclerotic drugs, initial encounter: Secondary | ICD-10-CM | POA: Diagnosis not present

## 2023-11-28 DIAGNOSIS — I771 Stricture of artery: Secondary | ICD-10-CM | POA: Diagnosis not present

## 2023-11-28 DIAGNOSIS — E785 Hyperlipidemia, unspecified: Secondary | ICD-10-CM | POA: Diagnosis not present

## 2023-11-28 DIAGNOSIS — G720 Drug-induced myopathy: Secondary | ICD-10-CM | POA: Diagnosis not present

## 2023-11-28 DIAGNOSIS — I25118 Atherosclerotic heart disease of native coronary artery with other forms of angina pectoris: Secondary | ICD-10-CM | POA: Diagnosis not present

## 2023-11-28 DIAGNOSIS — I6521 Occlusion and stenosis of right carotid artery: Secondary | ICD-10-CM | POA: Diagnosis not present

## 2023-11-28 DIAGNOSIS — I10 Essential (primary) hypertension: Secondary | ICD-10-CM | POA: Diagnosis not present

## 2023-11-28 DIAGNOSIS — I700 Atherosclerosis of aorta: Secondary | ICD-10-CM | POA: Diagnosis not present

## 2023-11-28 DIAGNOSIS — G63 Polyneuropathy in diseases classified elsewhere: Secondary | ICD-10-CM | POA: Diagnosis not present

## 2023-11-28 DIAGNOSIS — I6523 Occlusion and stenosis of bilateral carotid arteries: Secondary | ICD-10-CM | POA: Diagnosis not present

## 2023-11-28 DIAGNOSIS — E1140 Type 2 diabetes mellitus with diabetic neuropathy, unspecified: Secondary | ICD-10-CM | POA: Diagnosis not present

## 2023-11-28 DIAGNOSIS — E1169 Type 2 diabetes mellitus with other specified complication: Secondary | ICD-10-CM | POA: Diagnosis not present

## 2023-12-18 DIAGNOSIS — R351 Nocturia: Secondary | ICD-10-CM | POA: Diagnosis not present

## 2023-12-18 DIAGNOSIS — N401 Enlarged prostate with lower urinary tract symptoms: Secondary | ICD-10-CM | POA: Diagnosis not present

## 2024-02-12 DIAGNOSIS — R051 Acute cough: Secondary | ICD-10-CM | POA: Diagnosis not present

## 2024-02-12 DIAGNOSIS — R0981 Nasal congestion: Secondary | ICD-10-CM | POA: Diagnosis not present

## 2024-02-12 DIAGNOSIS — R0600 Dyspnea, unspecified: Secondary | ICD-10-CM | POA: Diagnosis not present

## 2024-06-11 DIAGNOSIS — Z0000 Encounter for general adult medical examination without abnormal findings: Secondary | ICD-10-CM | POA: Diagnosis not present

## 2024-07-30 DIAGNOSIS — Z125 Encounter for screening for malignant neoplasm of prostate: Secondary | ICD-10-CM | POA: Diagnosis not present

## 2024-07-30 DIAGNOSIS — R351 Nocturia: Secondary | ICD-10-CM | POA: Diagnosis not present

## 2024-07-30 DIAGNOSIS — N401 Enlarged prostate with lower urinary tract symptoms: Secondary | ICD-10-CM | POA: Diagnosis not present

## 2024-08-26 DIAGNOSIS — Z23 Encounter for immunization: Secondary | ICD-10-CM | POA: Diagnosis not present
